# Patient Record
Sex: Female | Born: 1980 | Hispanic: Yes | Marital: Single
Health system: Southern US, Community
[De-identification: ages and names within clinical notes are randomized; demographics above are authoritative.]

## PROBLEM LIST (undated history)

## (undated) DIAGNOSIS — O169 Unspecified maternal hypertension, unspecified trimester: Secondary | ICD-10-CM

## (undated) HISTORY — PX: NEPHRECTOMY: SHX65

---

## 2020-04-25 ENCOUNTER — Emergency Department (HOSPITAL_COMMUNITY): Payer: Self-pay

## 2020-04-25 ENCOUNTER — Other Ambulatory Visit: Payer: Self-pay

## 2020-04-25 ENCOUNTER — Emergency Department (HOSPITAL_COMMUNITY)
Admission: EM | Admit: 2020-04-25 | Discharge: 2020-04-25 | Disposition: A | Discharge status: Home / Self Care | Payer: Self-pay | Attending: Emergency Medicine | Admitting: Emergency Medicine

## 2020-04-25 ENCOUNTER — Encounter (HOSPITAL_COMMUNITY): Payer: Self-pay | Admitting: Emergency Medicine

## 2020-04-25 DIAGNOSIS — R102 Pelvic and perineal pain: Secondary | ICD-10-CM | POA: Insufficient documentation

## 2020-04-25 HISTORY — DX: Unspecified maternal hypertension, unspecified trimester: O16.9

## 2020-04-25 LAB — I-STAT BETA HCG BLOOD, ED (MC, WL, AP ONLY): I-stat hCG, quantitative: <5 m[IU]/mL (ref <5)

## 2020-04-25 MED ORDER — HYDRALAZINE HCL 20 MG/ML IJ SOLN
10.0000 mg | Freq: Once | INTRAMUSCULAR | Status: AC
  Administered 2020-04-25: 10 mg via INTRAVENOUS
  Filled 2020-04-25: qty 1

## 2020-04-25 MED ORDER — HYDRALAZINE HCL 10 MG PO TABS: 10.0000 mg | ORAL_TABLET | Freq: Three times a day (TID) | ORAL | 0 refills | Status: AC

## 2020-04-25 NOTE — Discharge Instructions (Addendum)
Please follow up with a primary care provider for further evaluation if symptoms continue. You are being started on hydralazine and will need to have your blood pressure followed up as an outpatient

## 2020-04-25 NOTE — ED Triage Notes (Signed)
Pt states she is approx 4 months pregnant with no prenatal care.  Denies any symptoms but states she is here for routine prenatal care.  States she is still having menstrual cycles.  Reports G4P3

## 2020-04-25 NOTE — ED Notes (Signed)
Discharge instructions discussed with pt via interpretor. Pt verbalized understanding. Pt stable and ambulatory. No signature pad available 

## 2020-04-25 NOTE — ED Provider Notes (Addendum)
[facility] EMERGENCY DEPARTMENT Provider Note   CSN: [id number] Arrival date & time: 04/25/20  1047     History Chief Complaint  Patient presents with  . pregnancy possibly    Brandy Pugh is a 39 y.o. female.  HPI  Level 5 caveat History obtained through interpreter 39 year old female G4 P3 presents today stating that she thinks she is 4 months pregnant.  She states she has had irregular bleeding over the past 4 months.  She feels that her lower abdomen is swollen and feels that she has movement in her lower abdomen back when she was pregnant.  Additionally she states that she has had high blood pressure only when she is pregnant.  She is hypertensive today.  It is unclear from the history whether or not she has had this for a regular basis.  She states she had 2 home pregnancy test that showed 1 line only, which I interpret is negative.     Past Medical History:  Diagnosis Date  . Hypertension during pregnancy     There are no problems to display for this patient.   Past Surgical History:  Procedure Laterality Date  . NEPHRECTOMY     patient states she had kidney removed for a cyst     OB History    Gravida  4   Para  3   Term      Preterm      AB      Living        SAB      TAB      Ectopic      Multiple      Live Births              No family history on file.  Social History   Tobacco Use  . Smoking status: Never Smoker  . Smokeless tobacco: Never Used  Substance Use Topics  . Alcohol use: Not Currently  . Drug use: Not Currently    Home Medications Prior to Admission medications   Not on File    Allergies    Patient has no allergy information on record.  Review of Systems   Review of Systems  All other systems reviewed and are negative.   Physical Exam Updated Vital Signs BP (!) 159/109   Pulse 85   Temp 98.1 F (36.7 C) (Oral)   Resp 16   SpO2 99%   Physical Exam Vitals and  nursing note reviewed. Exam conducted with a chaperone present.  Constitutional:      General: She is not in acute distress.    Appearance: Normal appearance. She is not ill-appearing.  HENT:     Head: Normocephalic.     Right Ear: External ear normal.     Left Ear: External ear normal.     Nose: Nose normal.     Mouth/Throat:     Mouth: Mucous membranes are moist.  Eyes:     Extraocular Movements: Extraocular movements intact.     Pupils: Pupils are equal, round, and reactive to light.  Cardiovascular:     Rate and Rhythm: Normal rate.     Pulses: Normal pulses.  Pulmonary:     Effort: Pulmonary effort is normal.     Breath sounds: Normal breath sounds.  Abdominal:     General: Abdomen is flat.     Palpations: Abdomen is soft.  Neurological:     Mental Status: She is alert.  ED Results / Procedures / Treatments   Labs (all labs ordered are listed, but only abnormal results are displayed) Labs Reviewed  I-STAT BETA HCG BLOOD, ED ([facility], [facility], [facility] ONLY)    EKG None  Radiology No results found.  Procedures Procedures (including critical care time)  Medications Ordered in ED Medications - No data to display  ED Course  I have reviewed the triage vital signs and the nursing notes.  Pertinent labs & imaging results that were available during my care of the patient were reviewed by me and considered in my medical decision making (see chart for details).    MDM Rules/Calculators/A&P                          [place] of pelvis obtained due to patients complaints of pelvic pressure and fullness. Plan d/c with outpatient f/u Hypertension treated here with hydralazine- bp now down to 152/101- will continue op hydralazine Final Clinical Impression(s) / ED Diagnoses Final diagnoses:  Pelvic pressure in female    Rx / DC Orders ED Discharge Orders    None       Margarita Grizzle, MD 04/25/20 1510    Margarita Grizzle, MD 04/28/20 1324

## 2020-04-25 NOTE — ED Notes (Signed)
Patient transported to Ultrasound 

## 2021-04-25 ENCOUNTER — Encounter (HOSPITAL_COMMUNITY): Payer: Self-pay | Admitting: Neurological Surgery

## 2021-04-25 ENCOUNTER — Inpatient Hospital Stay (HOSPITAL_COMMUNITY)
Admission: EM | Admit: 2021-04-25 | Discharge: 2021-05-27 | DRG: 064 | Disposition: E | Payer: Self-pay | Attending: Internal Medicine | Admitting: Internal Medicine

## 2021-04-25 ENCOUNTER — Emergency Department (HOSPITAL_COMMUNITY): Payer: Self-pay

## 2021-04-25 DIAGNOSIS — T1490XA Injury, unspecified, initial encounter: Principal | ICD-10-CM

## 2021-04-25 DIAGNOSIS — I619 Nontraumatic intracerebral hemorrhage, unspecified: Secondary | ICD-10-CM | POA: Diagnosis present

## 2021-04-25 DIAGNOSIS — Z515 Encounter for palliative care: Secondary | ICD-10-CM

## 2021-04-25 DIAGNOSIS — Z7189 Other specified counseling: Secondary | ICD-10-CM

## 2021-04-25 DIAGNOSIS — Z529 Donor of unspecified organ or tissue: Secondary | ICD-10-CM

## 2021-04-25 DIAGNOSIS — G936 Cerebral edema: Secondary | ICD-10-CM | POA: Diagnosis present

## 2021-04-25 DIAGNOSIS — N179 Acute kidney failure, unspecified: Secondary | ICD-10-CM | POA: Diagnosis not present

## 2021-04-25 DIAGNOSIS — Z9911 Dependence on respirator [ventilator] status: Secondary | ICD-10-CM

## 2021-04-25 DIAGNOSIS — Q600 Renal agenesis, unilateral: Secondary | ICD-10-CM

## 2021-04-25 DIAGNOSIS — Z66 Do not resuscitate: Secondary | ICD-10-CM | POA: Diagnosis present

## 2021-04-25 DIAGNOSIS — R404 Transient alteration of awareness: Secondary | ICD-10-CM | POA: Diagnosis present

## 2021-04-25 DIAGNOSIS — E870 Hyperosmolality and hypernatremia: Secondary | ICD-10-CM | POA: Diagnosis not present

## 2021-04-25 DIAGNOSIS — S0010XA Contusion of unspecified eyelid and periocular area, initial encounter: Secondary | ICD-10-CM | POA: Diagnosis present

## 2021-04-25 DIAGNOSIS — G9349 Other encephalopathy: Secondary | ICD-10-CM | POA: Diagnosis present

## 2021-04-25 DIAGNOSIS — I6011 Nontraumatic subarachnoid hemorrhage from right middle cerebral artery: Principal | ICD-10-CM | POA: Diagnosis present

## 2021-04-25 DIAGNOSIS — G9340 Encephalopathy, unspecified: Secondary | ICD-10-CM | POA: Diagnosis present

## 2021-04-25 DIAGNOSIS — I10 Essential (primary) hypertension: Secondary | ICD-10-CM | POA: Diagnosis present

## 2021-04-25 DIAGNOSIS — X58XXXA Exposure to other specified factors, initial encounter: Secondary | ICD-10-CM | POA: Diagnosis present

## 2021-04-25 DIAGNOSIS — I959 Hypotension, unspecified: Secondary | ICD-10-CM | POA: Diagnosis not present

## 2021-04-25 DIAGNOSIS — Z526 Liver donor: Secondary | ICD-10-CM

## 2021-04-25 DIAGNOSIS — G9382 Brain death: Secondary | ICD-10-CM | POA: Diagnosis not present

## 2021-04-25 DIAGNOSIS — Z20822 Contact with and (suspected) exposure to covid-19: Secondary | ICD-10-CM | POA: Diagnosis present

## 2021-04-25 DIAGNOSIS — J9601 Acute respiratory failure with hypoxia: Secondary | ICD-10-CM | POA: Diagnosis present

## 2021-04-25 DIAGNOSIS — R000 Tachycardia, unspecified: Secondary | ICD-10-CM | POA: Diagnosis present

## 2021-04-25 DIAGNOSIS — I169 Hypertensive crisis, unspecified: Secondary | ICD-10-CM | POA: Diagnosis not present

## 2021-04-25 DIAGNOSIS — I499 Cardiac arrhythmia, unspecified: Secondary | ICD-10-CM | POA: Diagnosis not present

## 2021-04-25 DIAGNOSIS — S0011XA Contusion of right eyelid and periocular area, initial encounter: Secondary | ICD-10-CM | POA: Diagnosis present

## 2021-04-25 LAB — I-STAT ARTERIAL BLOOD GAS, ED
Acid-base deficit: 4 mmol/L — ABNORMAL HIGH (ref 0.0–2.0)
Bicarbonate: 18.8 mmol/L — ABNORMAL LOW (ref 20.0–28.0)
Calcium, Ion: 1.08 mmol/L — ABNORMAL LOW (ref 1.15–1.40)
HCT: 46 % (ref 36.0–46.0)
Hemoglobin: 15.6 g/dL — ABNORMAL HIGH (ref 12.0–15.0)
O2 Saturation: 100 %
Patient temperature: 98.2
Potassium: 4 mmol/L (ref 3.5–5.1)
Sodium: 140 mmol/L (ref 135–145)
TCO2: 20 mmol/L — ABNORMAL LOW (ref 22–32)
pCO2 arterial: 27.7 mmHg — ABNORMAL LOW (ref 32.0–48.0)
pH, Arterial: 7.439 (ref 7.350–7.450)
pO2, Arterial: 261 mmHg — ABNORMAL HIGH (ref 83.0–108.0)

## 2021-04-25 LAB — I-STAT CHEM 8, ED
BUN: 10 mg/dL (ref 6–20)
Calcium, Ion: 0.94 mmol/L — ABNORMAL LOW (ref 1.15–1.40)
Chloride: 107 mmol/L (ref 98–111)
Creatinine, Ser: 1.2 mg/dL — ABNORMAL HIGH (ref 0.44–1.00)
Glucose, Bld: 258 mg/dL — ABNORMAL HIGH (ref 70–99)
HCT: 46 % (ref 36.0–46.0)
Hemoglobin: 15.6 g/dL — ABNORMAL HIGH (ref 12.0–15.0)
Potassium: 3.4 mmol/L — ABNORMAL LOW (ref 3.5–5.1)
Sodium: 138 mmol/L (ref 135–145)
TCO2: 18 mmol/L — ABNORMAL LOW (ref 22–32)

## 2021-04-25 LAB — SODIUM
Sodium: 147 mmol/L — ABNORMAL HIGH (ref 135–145)
Sodium: 147 mmol/L — ABNORMAL HIGH (ref 135–145)

## 2021-04-25 LAB — CBC
HCT: 44.7 % (ref 36.0–46.0)
Hemoglobin: 14.8 g/dL (ref 12.0–15.0)
MCH: 30.5 pg (ref 26.0–34.0)
MCHC: 33.1 g/dL (ref 30.0–36.0)
MCV: 92.2 fL (ref 80.0–100.0)
Platelets: 352 10*3/uL (ref 150–400)
RBC: 4.85 MIL/uL (ref 3.87–5.11)
RDW: 12.1 % (ref 11.5–15.5)
WBC: 25.8 10*3/uL — ABNORMAL HIGH (ref 4.0–10.5)
nRBC: 0 % (ref 0.0–0.2)

## 2021-04-25 LAB — URINALYSIS, ROUTINE W REFLEX MICROSCOPIC
Bacteria, UA: NONE SEEN
Bilirubin Urine: NEGATIVE
Glucose, UA: 150 mg/dL — AB
Ketones, ur: NEGATIVE mg/dL
Leukocytes,Ua: NEGATIVE
Nitrite: NEGATIVE
Protein, ur: 100 mg/dL — AB
Specific Gravity, Urine: 1.017 (ref 1.005–1.030)
pH: 7 (ref 5.0–8.0)

## 2021-04-25 LAB — COMPREHENSIVE METABOLIC PANEL
ALT: 22 U/L (ref 0–44)
AST: 33 U/L (ref 15–41)
Albumin: 4 g/dL (ref 3.5–5.0)
Alkaline Phosphatase: 74 U/L (ref 38–126)
Anion gap: 16 — ABNORMAL HIGH (ref 5–15)
BUN: 8 mg/dL (ref 6–20)
CO2: 15 mmol/L — ABNORMAL LOW (ref 22–32)
Calcium: 8.7 mg/dL — ABNORMAL LOW (ref 8.9–10.3)
Chloride: 105 mmol/L (ref 98–111)
Creatinine, Ser: 1.44 mg/dL — ABNORMAL HIGH (ref 0.44–1.00)
GFR, Estimated: 47 mL/min — ABNORMAL LOW (ref >60)
Glucose, Bld: 255 mg/dL — ABNORMAL HIGH (ref 70–99)
Potassium: 3.3 mmol/L — ABNORMAL LOW (ref 3.5–5.1)
Sodium: 136 mmol/L (ref 135–145)
Total Bilirubin: 0.4 mg/dL (ref 0.3–1.2)
Total Protein: 8.1 g/dL (ref 6.5–8.1)

## 2021-04-25 LAB — RAPID URINE DRUG SCREEN, HOSP PERFORMED
Amphetamines: POSITIVE — AB
Barbiturates: NOT DETECTED
Benzodiazepines: NOT DETECTED
Cocaine: NOT DETECTED
Opiates: NOT DETECTED
Tetrahydrocannabinol: NOT DETECTED

## 2021-04-25 LAB — LACTIC ACID, PLASMA: Lactic Acid, Venous: 6.4 mmol/L (ref 0.5–1.9)

## 2021-04-25 LAB — SAMPLE TO BLOOD BANK

## 2021-04-25 LAB — PROTIME-INR
INR: 1 (ref 0.8–1.2)
Prothrombin Time: 12.7 seconds (ref 11.4–15.2)

## 2021-04-25 LAB — RESP PANEL BY RT-PCR (FLU A&B, COVID) ARPGX2
Influenza A by PCR: NEGATIVE
Influenza B by PCR: NEGATIVE
SARS Coronavirus 2 by RT PCR: NEGATIVE

## 2021-04-25 LAB — MRSA NEXT GEN BY PCR, NASAL: MRSA by PCR Next Gen: NOT DETECTED

## 2021-04-25 LAB — ETHANOL: Alcohol, Ethyl (B): <10 mg/dL (ref <10)

## 2021-04-25 LAB — HIV ANTIBODY (ROUTINE TESTING W REFLEX): HIV Screen 4th Generation wRfx: NONREACTIVE

## 2021-04-25 MED ORDER — IOHEXOL 350 MG/ML SOLN
100.0000 mL | Freq: Once | INTRAVENOUS | Status: AC | PRN
  Administered 2021-04-25: 100 mL via INTRAVENOUS

## 2021-04-25 MED ORDER — POLYETHYLENE GLYCOL 3350 17 G PO PACK: 17.0000 g | PACK | Freq: Every day | ORAL | Status: DC | PRN

## 2021-04-25 MED ORDER — CHLORHEXIDINE GLUCONATE CLOTH 2 % EX PADS
6.0000 | MEDICATED_PAD | Freq: Every day | CUTANEOUS | Status: DC
  Administered 2021-04-25: 6 via TOPICAL

## 2021-04-25 MED ORDER — PROPOFOL 1000 MG/100ML IV EMUL
INTRAVENOUS | Status: AC | PRN
  Administered 2021-04-25: 40 ug/kg/min via INTRAVENOUS

## 2021-04-25 MED ORDER — ROCURONIUM BROMIDE 50 MG/5ML IV SOLN
INTRAVENOUS | Status: AC | PRN
Start: 2021-04-25 — End: 2021-04-25
  Administered 2021-04-25: 60 mg via INTRAVENOUS

## 2021-04-25 MED ORDER — NICARDIPINE HCL IN NACL 20-0.86 MG/200ML-% IV SOLN
INTRAVENOUS | Status: AC
  Administered 2021-04-25: 5 mg/h via INTRAVENOUS
  Filled 2021-04-25: qty 200

## 2021-04-25 MED ORDER — ORAL CARE MOUTH RINSE
15.0000 mL | OROMUCOSAL | Status: DC
  Administered 2021-04-25 – 2021-05-01 (×54): 15 mL via OROMUCOSAL

## 2021-04-25 MED ORDER — SODIUM CHLORIDE 3 % IV SOLN
INTRAVENOUS | Status: DC
  Filled 2021-04-25 (×2): qty 500

## 2021-04-25 MED ORDER — ETOMIDATE 2 MG/ML IV SOLN
INTRAVENOUS | Status: AC | PRN
  Administered 2021-04-25: 20 mg via INTRAVENOUS

## 2021-04-25 MED ORDER — DOCUSATE SODIUM 50 MG/5ML PO LIQD
100.0000 mg | Freq: Two times a day (BID) | ORAL | Status: DC | PRN
Start: 2021-04-25 — End: 2021-04-26

## 2021-04-25 MED ORDER — MIDAZOLAM HCL 2 MG/2ML IJ SOLN: 2.0000 mg | INTRAMUSCULAR | Status: DC | PRN

## 2021-04-25 MED ORDER — FENTANYL CITRATE PF 50 MCG/ML IJ SOSY: 50.0000 ug | PREFILLED_SYRINGE | INTRAMUSCULAR | Status: DC | PRN

## 2021-04-25 MED ORDER — LABETALOL HCL 5 MG/ML IV SOLN
INTRAVENOUS | Status: AC
  Administered 2021-04-25: 10 mg via INTRAVENOUS
  Filled 2021-04-25: qty 4

## 2021-04-25 MED ORDER — ONDANSETRON HCL 4 MG/2ML IJ SOLN: 4.0000 mg | Freq: Four times a day (QID) | INTRAMUSCULAR | Status: DC | PRN

## 2021-04-25 MED ORDER — LABETALOL HCL 5 MG/ML IV SOLN
10.0000 mg | INTRAVENOUS | Status: AC | PRN
  Administered 2021-04-25: 10 mg via INTRAVENOUS
  Filled 2021-04-25: qty 4

## 2021-04-25 MED ORDER — CHLORHEXIDINE GLUCONATE 0.12% ORAL RINSE (MEDLINE KIT)
15.0000 mL | Freq: Two times a day (BID) | OROMUCOSAL | Status: DC
  Administered 2021-04-25 – 2021-05-01 (×13): 15 mL via OROMUCOSAL

## 2021-04-25 MED ORDER — NICARDIPINE HCL IN NACL 20-0.86 MG/200ML-% IV SOLN: 3.0000 mg/h | INTRAVENOUS | Status: DC

## 2021-04-25 MED ORDER — SODIUM CHLORIDE 3 % IV BOLUS
250.0000 mL | Freq: Once | INTRAVENOUS | Status: AC
  Administered 2021-04-25: 250 mL via INTRAVENOUS

## 2021-04-25 MED ORDER — NOREPINEPHRINE 4 MG/250ML-% IV SOLN
0.0000 ug/min | INTRAVENOUS | Status: DC
Start: 2021-04-25 — End: 2021-05-01
  Administered 2021-04-25: 7 ug/min via INTRAVENOUS
  Administered 2021-04-25: 10 ug/min via INTRAVENOUS
  Administered 2021-04-26 – 2021-04-27 (×2): 4 ug/min via INTRAVENOUS
  Administered 2021-04-27: 8 ug/min via INTRAVENOUS
  Administered 2021-04-27: 7 ug/min via INTRAVENOUS
  Administered 2021-04-28: 24 ug/min via INTRAVENOUS
  Administered 2021-04-28: 14 ug/min via INTRAVENOUS
  Administered 2021-04-28: 22 ug/min via INTRAVENOUS
  Administered 2021-04-28: 17 ug/min via INTRAVENOUS
  Administered 2021-04-28: 10 ug/min via INTRAVENOUS
  Administered 2021-04-29: 24 ug/min via INTRAVENOUS
  Administered 2021-04-29 (×2): 21 ug/min via INTRAVENOUS
  Administered 2021-04-29: 26 ug/min via INTRAVENOUS
  Administered 2021-04-29: 22 ug/min via INTRAVENOUS
  Administered 2021-04-29: 24 ug/min via INTRAVENOUS
  Administered 2021-04-29: 23 ug/min via INTRAVENOUS
  Administered 2021-04-30: 5 ug/min via INTRAVENOUS
  Administered 2021-04-30: 13 ug/min via INTRAVENOUS
  Administered 2021-04-30: 12 ug/min via INTRAVENOUS
  Administered 2021-04-30: 16 ug/min via INTRAVENOUS
  Filled 2021-04-25 (×13): qty 250
  Filled 2021-04-25: qty 500
  Filled 2021-04-25 (×6): qty 250
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
## 2021-04-25 NOTE — ED Notes (Signed)
Medical examiner transferred to Dr. Rhunette Croft per his request
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
## 2021-04-25 NOTE — Progress Notes (Signed)
Patient transported to CT and back to TRA- B on ventilator with no complications.

## 2021-04-25 NOTE — Progress Notes (Signed)
RT assisted with transportation of this pt from ED to 4N22 while on full ventilatory support with no complications. RT gave report to 4N RT. RN currently at bedside.
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
## 2021-04-25 NOTE — ED Notes (Signed)
Chaplain paged for family. 
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
## 2021-04-25 NOTE — ED Notes (Signed)
RN spoke with Molly Maduro and Higden from [place], [phone]

## 2021-04-25 NOTE — H&P (Signed)
NAME:  Brandy Pugh, MRN:  [id number], DOB:  [birth year], LOS: 0 ADMISSION DATE:  03/30/2021, CONSULTATION DATE:  04/09/2021 REFERRING MD:  ed provider, CHIEF COMPLAINT: Unresponsive  History of Present Illness:  40 year old status found unresponsive.  Brought to the ED.  Intubated.  Imaging with devastating subarachnoid hemorrhage and cerebral hemorrhage.  Unsurvivable.  Neurosurgery and surgery is waiting.  DNR.  Awaiting on a bridge and organ donation evaluation.  PCCM to admit  Pertinent  Medical History  Unobtainable due to patient factors  Significant Hospital Events: Including procedures, antibiotic start and stop dates in addition to other pertinent events   10/30 admitted with encephalopathy, intubated, devastating brain bleed, subarachnoid hemorrhage on imaging  Interim History / Subjective:  As above  Objective   Blood pressure (!) 115/92, pulse 93, temperature 99.1 F (37.3 C), resp. rate 16, height 4\' 11"  (1.499 m), weight 60 kg, SpO2 100 %.    Vent Mode: PRVC FiO2 (%):  [40 %-100 %] 40 % Set Rate:  [16 bmp] 16 bmp Vt Set:  [350 mL] 350 mL PEEP:  [5 cmH20] 5 cmH20 Plateau Pressure:  [17 cmH20-18 cmH20] 17 cmH20   Intake/Output Summary (Last 24 hours) at 04/26/2021 1326 Last data filed at 03/31/2021 1325 Gross per 24 hour  Intake 1317.38 ml  Output 2350 ml  Net -1032.62 ml   Filed Weights   04/15/2021 0502  Weight: 60 kg    Examination: General: Intubated, sedated Neuro: Pupils fixed, no cough, gag present, will breathe over the vent  Resolved Hospital Problem list     Assessment & Plan:  Devastating intracerebral hemorrhage and subarachnoid hemorrhage: ICH score 5, not survivable.  Neurosurgery eval without intervention available.  Recommend comfort care.  I agree. --Peripheral norepinephrine, do not escalate past 10 mics per minute, no additional pressors --Comfort measures --Awaiting information on organ donation  Acute hypoxemic  respiratory failure: In the setting of encephalopathy from devastating brain bleed. --PRVC --Withdrawal once organ donation is decided, if develops brain death that we will need to be evaluated for   Best Practice (right click and "Reselect all SmartList Selections" daily)   DNR, comfort measures, n.p.o., no lines, Foley for comfort  Labs   CBC: Recent Labs  Lab 04/04/2021 0419 04/12/2021 0431 04/21/2021 0610  WBC 25.8*  --   --   HGB 14.8 15.6* 15.6*  HCT 44.7 46.0 46.0  MCV 92.2  --   --   PLT 352  --   --     Basic Metabolic Panel: Recent Labs  Lab 04/07/2021 0419 03/27/2021 0431 03/31/2021 0610  NA 136 138 140  K 3.3* 3.4* 4.0  CL 105 107  --   CO2 15*  --   --   GLUCOSE 255* 258*  --   BUN 8 10  --   CREATININE 1.44* 1.20*  --   CALCIUM 8.7*  --   --    GFR: Estimated Creatinine Clearance: 48.1 mL/min (A) (by C-G formula based on SCr of 1.2 mg/dL (H)). Recent Labs  Lab 04/12/2021 0419  WBC 25.8*  LATICACIDVEN 6.4*    Liver Function Tests: Recent Labs  Lab 03/29/2021 0419  AST 33  ALT 22  ALKPHOS 74  BILITOT 0.4  PROT 8.1  ALBUMIN 4.0   No results for input(s): LIPASE, AMYLASE in the last 168 hours. No results for input(s): AMMONIA in the last 168 hours.  ABG    Component Value Date/Time   PHART 7.439 04/24/2021 0610  PCO2ART 27.7 (L) May 05, 2021 0610   PO2ART 261 (H) 2021/05/05 0610   HCO3 18.8 (L) 05-05-2021 0610   TCO2 20 (L) 05/05/21 0610   ACIDBASEDEF 4.0 (H) 2021/05/05 0610   O2SAT 100.0 05-05-2021 0610     Coagulation Profile: Recent Labs  Lab 05-May-2021 0419  INR 1.0    Cardiac Enzymes: No results for input(s): CKTOTAL, CKMB, CKMBINDEX, TROPONINI in the last 168 hours.  HbA1C: No results found for: HGBA1C  CBG: No results for input(s): GLUCAP in the last 168 hours.  Review of Systems:   Unable to obtain due to patient factors DNR, comfort measures, Foley for comfort, no central line, A-line, no nutrition at this time  Past  Medical History:  She,  has no past medical history on file.   Surgical History:  History reviewed. No pertinent surgical history.   Social History:      Family History:  Her family history is not on file.   Allergies Not on File   Home Medications  Prior to Admission medications   Not on File     Critical care time: n/a

## 2021-04-25 NOTE — ED Notes (Signed)
[place], [employer], CSI, and Detectives are at bedside.

## 2021-04-25 NOTE — ED Notes (Signed)
7262 Neurosurgery at bedside, noted flexion when painful stimuli performed while propofol turned off

## 2021-04-25 NOTE — ED Notes (Signed)
Lactic acid 6.4 verbalized to Dr. Clayborne Dana
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
## 2021-04-26 DIAGNOSIS — I619 Nontraumatic intracerebral hemorrhage, unspecified: Secondary | ICD-10-CM

## 2021-04-26 LAB — GLUCOSE, CAPILLARY
Glucose-Capillary: 136 mg/dL — ABNORMAL HIGH (ref 70–99)
Glucose-Capillary: 132 mg/dL — ABNORMAL HIGH (ref 70–99)
Glucose-Capillary: 144 mg/dL — ABNORMAL HIGH (ref 70–99)

## 2021-04-26 LAB — SODIUM
Sodium: 148 mmol/L — ABNORMAL HIGH (ref 135–145)
Sodium: 149 mmol/L — ABNORMAL HIGH (ref 135–145)

## 2021-04-26 MED ORDER — CHLORHEXIDINE GLUCONATE CLOTH 2 % EX PADS
6.0000 | MEDICATED_PAD | Freq: Every day | CUTANEOUS | Status: DC
  Administered 2021-04-27 – 2021-05-01 (×5): 6 via TOPICAL
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
## 2021-04-26 NOTE — Progress Notes (Signed)
**Note Brandy-Identified via Obfuscation** Family conference call today with son, Brandy Pugh, who lives in [place]. Meeting facilitated by myself, Amil Amen from Palliative, and Graciella from interpretive services. Brandy Pugh is the closest next-of-kin that could make decisions for his mother and he is 40years old. There are 2 other children, 2 daughters under the age of 71. The daughters live with the father, Brandy Pugh, in [place], [place]. Brandy Pugh is requesting his father's presence at [facility] beside to aid in the decision making process in his absence.  We made attempts to contact [place] directly and were unsuccessful. We are under the assumption that Brandy Pugh is en-route to [facility] and will hopefully be present tomorrow.   The grave prognosis was explained in depth with Brandy Pugh and all questions were answered. Will continue to stand-by for further input from family. Until then we are keeping the current plan-pt is DNR and requiring supportive care from the ventilator and a vasopressor.
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
## 2021-04-26 NOTE — TOC CAGE-AID Note (Signed)
Transition of Care [facility]) - CAGE-AID Screening   Patient Details  Name: Brandy Pugh MRN: [id number] Date of Birth: 1981-04-15  Transition of Care [facility]) CM/SW Contact:    Ladan Vanderzanden C Pugh, LCSWA Phone Number: 04/26/2021, 12:12 PM   Clinical Narrative: Pt is unable to participate in Cage Aid.  Pt is intubated.  Brandy Pugh, MSW, LCSW-A Pronouns:  She/Her/Hers [facility] HealthTransitions of Care Clinical Social Worker Direct Number:  [phone] [email or web address]  CAGE-AID Screening: Substance Abuse Screening unable to be completed due to: : Patient unable to participate (Pt is intubated.)             Substance Abuse Education Offered: No

## 2021-04-27 DIAGNOSIS — Z66 Do not resuscitate: Secondary | ICD-10-CM

## 2021-04-27 LAB — BASIC METABOLIC PANEL
Anion gap: 7 (ref 5–15)
BUN: 25 mg/dL — ABNORMAL HIGH (ref 6–20)
CO2: 21 mmol/L — ABNORMAL LOW (ref 22–32)
Calcium: 7.8 mg/dL — ABNORMAL LOW (ref 8.9–10.3)
Chloride: 125 mmol/L — ABNORMAL HIGH (ref 98–111)
Creatinine, Ser: 3.01 mg/dL — ABNORMAL HIGH (ref 0.44–1.00)
GFR, Estimated: 19 mL/min — ABNORMAL LOW (ref >60)
Glucose, Bld: 136 mg/dL — ABNORMAL HIGH (ref 70–99)
Potassium: 4.2 mmol/L (ref 3.5–5.1)
Sodium: 153 mmol/L — ABNORMAL HIGH (ref 135–145)

## 2021-04-27 MED ORDER — LACTATED RINGERS IV SOLN: INTRAVENOUS | Status: DC

## 2021-04-27 NOTE — Progress Notes (Signed)
Spoke with pt's cousin, Morton Stall, on the phone.  She stated that De Nurse is on the way to [facility].  His ETA is tomorrow morning at 0600.  Will continue to monitor the situation.

## 2021-04-27 NOTE — Progress Notes (Signed)
Daily Progress Note   Patient Name: Brandy Pugh       Date: 04/27/2021 DOB: May 12, 1981  Age: 40 y.o. MRN#: [id number] Attending Physician: Lorin Glass, MD Primary Care Physician: Default, Provider, MD Admit Date: 2021-05-24  Reason for Consultation/Follow-up: Establishing goals of care  Subjective: Update received from bedside RN. Levophed is at . Neuro exam remains unchanged (no gag or corneal reflex).    With Graciella as interpreter, I spoke with Elson Clan (patient's former partner and father of her 3 children) by phone. I reviewed patient's current medical condition with him in detail. I provided education on the diagnosis of ruptured aneurysm and discussed the natural trajectory of a sudden and severe neurological injury. Emphasized that the medical team has concurred this is a devastating brain bleed with no chance of neurologic recovery. Emphasized that she is unresponsive and on life support. Marco verbalizes understanding. He states he is in the process of fixing his vehicle and is hopeful to leave [place] today. Discussed that Jordan Likes (patient's oldest son) has deferred decision making to Reola Mosher states he is accepting of this responsibility, but is not willing to make any decisions until he can he can see patient in-person.    Length of Stay: 2    Palliative Assessment/Data: PPS 10%      Palliative Care Assessment & Plan   HPI/Patient Profile: 40 y.o. female  with unknown past medical history who was brought to the emergency department on 05-24-21 after being found unresponsive at home. 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 Glasgow of 3 and fixed pupils. Initially very  hypertensive, but then became hypotensive. Per neurosurgery, "very grim prognosis for functional recovery" and no recommendation for surgery, ventriculostomy, or interventional radiology.  Admitted to PCCM.    Assessment: Very unfortunate patient with a non-survivable brain bleed secondary to ruptured aneurysm. Complicated family and social situation and challenges with locating and establishing a decision maker.  Recommendations/Plan: Continue current care until family arrives (hopefully tomorrow) Patient's oldest son Jordan Likes has deferred decision making to his father De Nurse (patient's former partner and father of her 3 children)  If in-person interpretation needed for Wednesday 11/2, will need to call [phone] PMT will continue to follow and support  Code Status: DNR  Prognosis:  Very poor  Discharge Planning: Anticipated Hospital Death   Thank you for allowing the Palliative Medicine Team to assist in the care of this patient.   Total Time 35 minutes Prolonged Time Billed  no       Greater than 50%  of this time was spent counseling and coordinating care related to the above assessment and plan.  Merry Proud, NP  Please contact Palliative Medicine Team phone at [phone] for questions and concerns.
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
## 2021-04-27 NOTE — Progress Notes (Deleted)
Family at bedside Informed them of patient's passing Patient is not a registered organ donor Extubation order placed  Myrla Halsted MD PCCM

## 2021-04-27 DEATH — deceased

## 2021-04-28 ENCOUNTER — Inpatient Hospital Stay (HOSPITAL_COMMUNITY): Payer: Self-pay

## 2021-04-28 LAB — POCT I-STAT 7, (LYTES, BLD GAS, ICA,H+H)
Acid-base deficit: 4 mmol/L — ABNORMAL HIGH (ref 0.0–2.0)
Acid-base deficit: 5 mmol/L — ABNORMAL HIGH (ref 0.0–2.0)
Bicarbonate: 24.6 mmol/L (ref 20.0–28.0)
Bicarbonate: 20.9 mmol/L (ref 20.0–28.0)
Calcium, Ion: 1.28 mmol/L (ref 1.15–1.40)
Calcium, Ion: 1.23 mmol/L (ref 1.15–1.40)
HCT: 35 % — ABNORMAL LOW (ref 36.0–46.0)
HCT: 54 % — ABNORMAL HIGH (ref 36.0–46.0)
Hemoglobin: 11.9 g/dL — ABNORMAL LOW (ref 12.0–15.0)
Hemoglobin: 18.4 g/dL — ABNORMAL HIGH (ref 12.0–15.0)
O2 Saturation: 91 %
O2 Saturation: 92 %
Patient temperature: 100
Potassium: 4.7 mmol/L (ref 3.5–5.1)
Potassium: 4.6 mmol/L (ref 3.5–5.1)
Sodium: 157 mmol/L — ABNORMAL HIGH (ref 135–145)
Sodium: 158 mmol/L — ABNORMAL HIGH (ref 135–145)
TCO2: 26 mmol/L (ref 22–32)
TCO2: 22 mmol/L (ref 22–32)
pCO2 arterial: 61.1 mmHg — ABNORMAL HIGH (ref 32.0–48.0)
pCO2 arterial: 41.4 mmHg (ref 32.0–48.0)
pH, Arterial: 7.213 — ABNORMAL LOW (ref 7.350–7.450)
pH, Arterial: 7.315 — ABNORMAL LOW (ref 7.350–7.450)
pO2, Arterial: 76 mmHg — ABNORMAL LOW (ref 83.0–108.0)
pO2, Arterial: 73 mmHg — ABNORMAL LOW (ref 83.0–108.0)

## 2021-04-28 LAB — BASIC METABOLIC PANEL
Anion gap: 8 (ref 5–15)
BUN: 30 mg/dL — ABNORMAL HIGH (ref 6–20)
CO2: 21 mmol/L — ABNORMAL LOW (ref 22–32)
Calcium: 8.6 mg/dL — ABNORMAL LOW (ref 8.9–10.3)
Chloride: 125 mmol/L — ABNORMAL HIGH (ref 98–111)
Creatinine, Ser: 3.48 mg/dL — ABNORMAL HIGH (ref 0.44–1.00)
GFR, Estimated: 16 mL/min — ABNORMAL LOW (ref >60)
Glucose, Bld: 147 mg/dL — ABNORMAL HIGH (ref 70–99)
Potassium: 4.6 mmol/L (ref 3.5–5.1)
Sodium: 154 mmol/L — ABNORMAL HIGH (ref 135–145)

## 2021-04-28 MED ORDER — TECHNETIUM TC 99M EXAMETAZIME IV KIT
20.9000 | PACK | Freq: Once | INTRAVENOUS | Status: AC | PRN
  Administered 2021-04-28: 20.9 via INTRAVENOUS

## 2021-04-28 MED ORDER — FREE WATER
400.0000 mL | Freq: Four times a day (QID) | Status: DC
  Administered 2021-04-28 – 2021-04-29 (×5): 400 mL

## 2021-04-28 MED ORDER — LACTATED RINGERS IV BOLUS
1000.0000 mL | Freq: Once | INTRAVENOUS | Status: AC
  Administered 2021-04-28: 1000 mL via INTRAVENOUS

## 2021-04-28 NOTE — Progress Notes (Signed)
PCCM Progress Note  Name: Brandy Pugh MRN: [id number] DOB: [birth year] Date of admission: 2021/04/27 CC: unresponsive  Summary: 40 y/o female found unresponsive and brought to ER.  Intubated for airway protection.  Neuro imaging showed Grade 5 SAH and ICH 6 x 4 cm.  Seen by neurosurgery and was deemed not a candidate for intervention with poor prognosis.  Family opted for DNR status.  Subjective: On vent Boyfriend at bedside  Vitals: BP (!) 97/53 (BP Location: Right Arm) Comment: NP aware not at goal, levo maxed, bolus running  Pulse (!) 120   Temp 100 F (37.8 C) (Axillary)   Resp 16   Ht 4\' 11"  (1.499 m)   Wt 61.6 kg   SpO2 90%   BMI 27.43 kg/m   Intake/outpt: I/O last 3 completed shifts: In: 2295.6 [I.V.:2295.6] Out: 1675 [Urine:1675]  Physical exam: GCS3 No cranial nerves Has stopped triggering vent Scattered rhonci R suborbital ecchymoses  Assessment: Grade 5 SAH from rupturred Rt MCA aneurysm measuring 3 mm with 6.6 x 4.7 cm hematoma with 60 cc volume centered in lower Rt sylvian fissure with 9 mm midline shift Acute hypoxemic respiratory failure due to inability to protect airway Worsening renal dysfunction  Plan: DNR  I think she has progressed to brain death, will do formal testing May be an ME case, RN to reach out Need to touch base with whoever NOK is, will also ask RN to help determine this Continue supportive care including vent, starting free water in interim   33 minutes critical care time 1676 MD PCCM

## 2021-04-28 NOTE — Procedures (Signed)
Adult Brain Death Determination  Time of Examination: 2021-05-22 3:01 PM  No Evidence of /Cause of Reversible CNS Depression  Core temperature must be greater >36 degrees. Last temp: Temp: 98.9 F (37.2 C) (Note: If unable to achieve normothermia after 12 hours of temperature management, may consider proceeding with Brain Death Evaluation.):    yes  Evidence of severe metabolic perturbations that could potentate CNS depression. Consider glucose, Na, creatinine, PaCO2, SaO2.:    Absent  Evidence of drugs, by history or measurement, that could potentiate central nervous system depression: narcotics, ethanol, benzodiazepines, barbiturates, neuromuscular blockade.:     Absent  Absence of Cortical Function  GCS = 3:    yes  Absence of Brain Stem Reflexes and Responses  Pupils light-fixed    yes  Corneal reflexes:    Absent  Response to upper and lower airway stimulation, such as pharyngeal and endotracheal suctioning.:    Absent  Ocular response to head turning (eye movement).    Absent  Absence of Spontaneous Respirations  (Apnea test performed per Brain Death Policy. If not met due to hemodynamic/ventilatory instability, then perform EEG, TCD, or cerebral blow flow studies.)  1.   Spontaneous Respirations   Absent  Too unstable due to high oxygen and ventilation needs to perform apnea test safely so cerebral flow study performed.  Document Confirmatory Test Utilized: (Optional) Nuclear cerebral flow, cerebral angiography (CT/MR angio), transcranial Doppler ultrasound, EEG, SSEP (record results).  Test results (if available):  May 22, 2021 Nuclear brain flow study: no flow  Patient pronounced dead by neurological criteria at 15:00 on 2021/05/22.  Brandy Furbish, MD 05/22/21 3:01 PM

## 2021-04-28 NOTE — Progress Notes (Signed)
Met with family this AM and discussed brain death and nuclear scan to confirm.  She is DNR, they will have until AM to say their goodbyes and we will remove from ventilator.  Erskine Emery MD PCCM
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
## 2021-04-28 NOTE — Progress Notes (Signed)
Pt transported on vent  from 4N22 to nuclear med and back without any complications. Vitals stable, RN at bedside, RT will continue to monitor.

## 2021-04-28 NOTE — Progress Notes (Addendum)
Regarding the question of trauma induced, here are the medical findings: - Has a clear ruptured R MCA aneurysm by CTA - No evidence of trauma on pan-CT - She has a small R periorbital hematoma on exam but not on CT - Amphetamines found in urine  Myrla Halsted MD PCCM

## 2021-04-28 NOTE — Progress Notes (Signed)
Lung mechanics are worsening, unable to get adequate ventilation and oxygenation for apnea testing.  Will proceed with nuclear flow scan.  Still awaiting family.  Myrla Halsted MD PCCM

## 2021-04-29 ENCOUNTER — Other Ambulatory Visit (HOSPITAL_COMMUNITY): Payer: Self-pay

## 2021-04-29 DIAGNOSIS — Z529 Donor of unspecified organ or tissue: Secondary | ICD-10-CM

## 2021-04-29 LAB — POCT I-STAT 7, (LYTES, BLD GAS, ICA,H+H)
Acid-base deficit: 9 mmol/L — ABNORMAL HIGH (ref 0.0–2.0)
Bicarbonate: 15.9 mmol/L — ABNORMAL LOW (ref 20.0–28.0)
Calcium, Ion: 1.18 mmol/L (ref 1.15–1.40)
HCT: 34 % — ABNORMAL LOW (ref 36.0–46.0)
Hemoglobin: 11.6 g/dL — ABNORMAL LOW (ref 12.0–15.0)
O2 Saturation: 99 %
Patient temperature: 98.3
Potassium: 4.1 mmol/L (ref 3.5–5.1)
Sodium: 151 mmol/L — ABNORMAL HIGH (ref 135–145)
TCO2: 17 mmol/L — ABNORMAL LOW (ref 22–32)
pCO2 arterial: 28.9 mmHg — ABNORMAL LOW (ref 32.0–48.0)
pH, Arterial: 7.349 — ABNORMAL LOW (ref 7.350–7.450)
pO2, Arterial: 143 mmHg — ABNORMAL HIGH (ref 83.0–108.0)

## 2021-04-29 LAB — RESP PANEL BY RT-PCR (FLU A&B, COVID) ARPGX2
Influenza A by PCR: NEGATIVE
Influenza B by PCR: NEGATIVE
SARS Coronavirus 2 by RT PCR: NEGATIVE

## 2021-04-29 LAB — MAGNESIUM: Magnesium: 1.7 mg/dL (ref 1.7–2.4)

## 2021-04-29 LAB — COMPREHENSIVE METABOLIC PANEL
ALT: 16 U/L (ref 0–44)
AST: 36 U/L (ref 15–41)
Albumin: 1.6 g/dL — ABNORMAL LOW (ref 3.5–5.0)
Alkaline Phosphatase: 93 U/L (ref 38–126)
Anion gap: 13 (ref 5–15)
BUN: 37 mg/dL — ABNORMAL HIGH (ref 6–20)
CO2: 17 mmol/L — ABNORMAL LOW (ref 22–32)
Calcium: 8.2 mg/dL — ABNORMAL LOW (ref 8.9–10.3)
Chloride: 119 mmol/L — ABNORMAL HIGH (ref 98–111)
Creatinine, Ser: 4.3 mg/dL — ABNORMAL HIGH (ref 0.44–1.00)
GFR, Estimated: 13 mL/min — ABNORMAL LOW (ref >60)
Glucose, Bld: 135 mg/dL — ABNORMAL HIGH (ref 70–99)
Potassium: 4.1 mmol/L (ref 3.5–5.1)
Sodium: 149 mmol/L — ABNORMAL HIGH (ref 135–145)
Total Bilirubin: 0.8 mg/dL (ref 0.3–1.2)
Total Protein: 5.2 g/dL — ABNORMAL LOW (ref 6.5–8.1)

## 2021-04-29 LAB — TYPE AND SCREEN
ABO/RH(D): O POS
Antibody Screen: NEGATIVE

## 2021-04-29 LAB — EXPECTORATED SPUTUM ASSESSMENT W GRAM STAIN, RFLX TO RESP C

## 2021-04-29 LAB — AMYLASE: Amylase: 19 U/L — ABNORMAL LOW (ref 28–100)

## 2021-04-29 LAB — BASIC METABOLIC PANEL
Anion gap: 14 (ref 5–15)
BUN: 34 mg/dL — ABNORMAL HIGH (ref 6–20)
CO2: 17 mmol/L — ABNORMAL LOW (ref 22–32)
Calcium: 8.5 mg/dL — ABNORMAL LOW (ref 8.9–10.3)
Chloride: 117 mmol/L — ABNORMAL HIGH (ref 98–111)
Creatinine, Ser: 4.22 mg/dL — ABNORMAL HIGH (ref 0.44–1.00)
GFR, Estimated: 13 mL/min — ABNORMAL LOW (ref >60)
Glucose, Bld: 152 mg/dL — ABNORMAL HIGH (ref 70–99)
Potassium: 3.5 mmol/L (ref 3.5–5.1)
Sodium: 148 mmol/L — ABNORMAL HIGH (ref 135–145)

## 2021-04-29 LAB — PROTIME-INR
INR: 1.6 — ABNORMAL HIGH (ref 0.8–1.2)
Prothrombin Time: 19 seconds — ABNORMAL HIGH (ref 11.4–15.2)

## 2021-04-29 LAB — LIPASE, BLOOD: Lipase: 20 U/L (ref 11–51)

## 2021-04-29 LAB — CBC
HCT: 36 % (ref 36.0–46.0)
Hemoglobin: 11.2 g/dL — ABNORMAL LOW (ref 12.0–15.0)
MCH: 30.8 pg (ref 26.0–34.0)
MCHC: 31.1 g/dL (ref 30.0–36.0)
MCV: 98.9 fL (ref 80.0–100.0)
Platelets: 130 10*3/uL — ABNORMAL LOW (ref 150–400)
RBC: 3.64 MIL/uL — ABNORMAL LOW (ref 3.87–5.11)
RDW: 13.4 % (ref 11.5–15.5)
WBC: 15.4 10*3/uL — ABNORMAL HIGH (ref 4.0–10.5)
nRBC: 0.1 % (ref 0.0–0.2)

## 2021-04-29 LAB — HEMOGLOBIN A1C
Hgb A1c MFr Bld: 5.6 % (ref 4.8–5.6)
Mean Plasma Glucose: 114.02 mg/dL

## 2021-04-29 LAB — APTT: aPTT: 42 seconds — ABNORMAL HIGH (ref 24–36)

## 2021-04-29 LAB — ABO/RH: ABO/RH(D): O POS

## 2021-04-29 LAB — BILIRUBIN, DIRECT: Bilirubin, Direct: 0.4 mg/dL — ABNORMAL HIGH (ref 0.0–0.2)

## 2021-04-29 LAB — LACTIC ACID, PLASMA: Lactic Acid, Venous: 6.4 mmol/L (ref 0.5–1.9)

## 2021-04-29 LAB — GLUCOSE, CAPILLARY: Glucose-Capillary: 128 mg/dL — ABNORMAL HIGH (ref 70–99)

## 2021-04-29 LAB — PHOSPHORUS: Phosphorus: 7 mg/dL — ABNORMAL HIGH (ref 2.5–4.6)

## 2021-04-29 LAB — HCG, QUANTITATIVE, PREGNANCY: hCG, Beta Chain, Quant, S: 1 m[IU]/mL (ref <5)

## 2021-04-29 MED ORDER — SODIUM CHLORIDE 0.9 % IV SOLN
2000.0000 mg | Freq: Once | INTRAVENOUS | Status: AC
  Administered 2021-04-29: 2000 mg via INTRAVENOUS
  Filled 2021-04-29: qty 32

## 2021-04-29 MED ORDER — LIDOCAINE HCL (PF) 1 % IJ SOLN
INTRAMUSCULAR | Status: AC
  Filled 2021-04-29: qty 30

## 2021-04-29 MED ORDER — INSULIN ASPART 100 UNIT/ML IJ SOLN
20.0000 [IU] | Freq: Once | INTRAMUSCULAR | Status: AC
  Administered 2021-04-29: 20 [IU] via SUBCUTANEOUS

## 2021-04-29 MED ORDER — SODIUM CHLORIDE 0.9 % IV SOLN: INTRAVENOUS | Status: DC | PRN

## 2021-04-29 MED ORDER — PIPERACILLIN-TAZOBACTAM IN DEX 2-0.25 GM/50ML IV SOLN
2.2500 g | Freq: Three times a day (TID) | INTRAVENOUS | Status: DC
  Administered 2021-04-29 – 2021-05-01 (×7): 2.25 g via INTRAVENOUS
  Filled 2021-04-29 (×10): qty 50

## 2021-04-29 MED ORDER — DEXTROSE 50 % IV SOLN
50.0000 mL | Freq: Once | INTRAVENOUS | Status: AC
  Administered 2021-04-29: 50 mL via INTRAVENOUS
  Filled 2021-04-29: qty 50

## 2021-04-29 MED ORDER — LEVOTHYROXINE SODIUM 100 MCG/5ML IV SOLN
10.0000 ug/h | INTRAVENOUS | Status: DC
  Administered 2021-04-29: 10 ug/h via INTRAVENOUS
  Administered 2021-04-30 – 2021-05-01 (×3): 20 ug/h via INTRAVENOUS
  Filled 2021-04-29 (×5): qty 10

## 2021-04-29 MED ORDER — LEVOTHYROXINE SODIUM 100 MCG/5ML IV SOLN
20.0000 ug | Freq: Once | INTRAVENOUS | Status: AC
  Administered 2021-04-29: 20 ug via INTRAVENOUS
  Filled 2021-04-29: qty 5

## 2021-04-29 NOTE — TOC CM/SW Note (Signed)
Patient's friend and children given meal vouchers, per request.  Explained use of meal vouchers, with use of [employer] [phone], Donia Ast, RN, BSN  Trauma/Neuro ICU Case Manager [phone]

## 2021-04-29 NOTE — Procedures (Signed)
Arterial Catheter Insertion Procedure Note  Brandy Pugh  [id number]  [birth year]  Date:04/29/21  Time:6:05 PM    Provider Performing: Eliezer Champagne    Procedure: Insertion of Arterial Line (13244) with US guidance (01027)   Indication(s) Blood pressure monitoring and/or need for frequent ABGs  Consent Risks of the procedure as well as the alternatives and risks of each were explained to the patient and/or caregiver.  Consent for the procedure was obtained and is signed in the bedside chart  Anesthesia None   Time Out Verified patient identification, verified procedure, site/side was marked, verified correct patient position, special equipment/implants available, medications/allergies/relevant history reviewed, required imaging and test results available.   Sterile Technique Maximal sterile technique including full sterile barrier drape, hand hygiene, sterile gown, sterile gloves, mask, hair covering, sterile ultrasound probe cover (if used).   Procedure Description Area of catheter insertion was cleaned with chlorhexidine and draped in sterile fashion. With real-time ultrasound guidance an arterial catheter was placed into the right femoral artery.  Appropriate arterial tracings confirmed on monitor.     Complications/Tolerance None; patient tolerated the procedure well.   EBL Minimal   Specimen(s) None  [name], MSN, APRN, AGACNP-BC [facility]  04/29/2021 , 6:06 PM  Please see [email or web address] for pager details  If no response, please call [phone] After hours, please call Elink at [phone]

## 2021-04-29 NOTE — Procedures (Signed)
Central Venous Catheter Insertion Procedure Note  Brandy Pugh  [id number]  10-Apr-1981  Date:04/29/21  Time:6:04 PM   Provider Performing:Silver Achey Hezzie Bump   Procedure: Insertion of Non-tunneled Central Venous [phone]) with US guidance ([id number])   Indication(s) Medication administration  Consent Risks of the procedure as well as the alternatives and risks of each were explained to the patient and/or caregiver.  Consent for the procedure was obtained and is signed in the bedside chart  Anesthesia Topical only with 1% lidocaine   Timeout Verified patient identification, verified procedure, site/side was marked, verified correct patient position, special equipment/implants available, medications/allergies/relevant history reviewed, required imaging and test results available.  Sterile Technique Maximal sterile technique including full sterile barrier drape, hand hygiene, sterile gown, sterile gloves, mask, hair covering, sterile ultrasound probe cover (if used).  Procedure Description Area of catheter insertion was cleaned with chlorhexidine and draped in sterile fashion.  With real-time ultrasound guidance a central venous catheter was placed into the right femoral vein. Nonpulsatile blood flow and easy flushing noted in all ports.  The catheter was sutured in place and sterile dressing applied.     Complications/Tolerance  Chest x-ray is not ordered for femoral cannulation.  EBL Minimal  Specimen(s) None  [name], MSN, APRN, AGACNP-BC [facility]  04/29/2021 , 6:05 PM  Please see [email or web address] for pager details  If no response, please call [phone] After hours, please call Elink at [phone]

## 2021-04-29 NOTE — Progress Notes (Signed)
PT ABG as follows:  PH 7.34 PCO2 29.1  PO2 144   HCO3 15.9   BE -9 TCO2 17
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
## 2021-04-29 NOTE — Progress Notes (Signed)
eLink Physician-Brief Progress Note Patient Name: Brandy Pugh DOB: April 13, 1981 MRN: [id number]   Date of Service  04/29/2021  HPI/Events of Note  Notified of K 3.5, crea 4.22, GFR 13.   eICU Interventions  Continue to monitor.  Hold off on K repletion.      Intervention Category Minor Interventions: Electrolytes abnormality - evaluation and management  Larinda Buttery 04/29/2021, 5:38 AM

## 2021-04-29 NOTE — Progress Notes (Signed)
With aide of medical interpreter, provided emotional support to family at bedside throughout day as well as answered any questions.

## 2021-04-30 LAB — URINALYSIS, ROUTINE W REFLEX MICROSCOPIC
Bilirubin Urine: NEGATIVE
Glucose, UA: NEGATIVE mg/dL
Ketones, ur: NEGATIVE mg/dL
Nitrite: NEGATIVE
Protein, ur: NEGATIVE mg/dL
Specific Gravity, Urine: 1.004 — ABNORMAL LOW (ref 1.005–1.030)
pH: 5 (ref 5.0–8.0)

## 2021-04-30 LAB — CBC WITH DIFFERENTIAL/PLATELET
Abs Immature Granulocytes: 0.14 10*3/uL — ABNORMAL HIGH (ref 0.00–0.07)
Basophils Absolute: 0 10*3/uL (ref 0.0–0.1)
Basophils Relative: 0 %
Eosinophils Absolute: 0 10*3/uL (ref 0.0–0.5)
Eosinophils Relative: 0 %
HCT: 35.2 % — ABNORMAL LOW (ref 36.0–46.0)
Hemoglobin: 10.8 g/dL — ABNORMAL LOW (ref 12.0–15.0)
Immature Granulocytes: 1 %
Lymphocytes Relative: 4 %
Lymphs Abs: 0.7 10*3/uL (ref 0.7–4.0)
MCH: 30.4 pg (ref 26.0–34.0)
MCHC: 30.7 g/dL (ref 30.0–36.0)
MCV: 99.2 fL (ref 80.0–100.0)
Monocytes Absolute: 0.5 10*3/uL (ref 0.1–1.0)
Monocytes Relative: 3 %
Neutro Abs: 15 10*3/uL — ABNORMAL HIGH (ref 1.7–7.7)
Neutrophils Relative %: 92 %
Platelets: 125 10*3/uL — ABNORMAL LOW (ref 150–400)
RBC: 3.55 MIL/uL — ABNORMAL LOW (ref 3.87–5.11)
RDW: 13.5 % (ref 11.5–15.5)
WBC: 16.4 10*3/uL — ABNORMAL HIGH (ref 4.0–10.5)
nRBC: 0 % (ref 0.0–0.2)

## 2021-04-30 LAB — COMPREHENSIVE METABOLIC PANEL
ALT: 17 U/L (ref 0–44)
ALT: 18 U/L (ref 0–44)
ALT: 19 U/L (ref 0–44)
AST: 30 U/L (ref 15–41)
AST: 31 U/L (ref 15–41)
AST: 31 U/L (ref 15–41)
Albumin: 1.6 g/dL — ABNORMAL LOW (ref 3.5–5.0)
Albumin: 1.5 g/dL — ABNORMAL LOW (ref 3.5–5.0)
Albumin: 2.3 g/dL — ABNORMAL LOW (ref 3.5–5.0)
Alkaline Phosphatase: 91 U/L (ref 38–126)
Alkaline Phosphatase: 93 U/L (ref 38–126)
Alkaline Phosphatase: 71 U/L (ref 38–126)
Anion gap: 13 (ref 5–15)
Anion gap: 15 (ref 5–15)
Anion gap: 16 — ABNORMAL HIGH (ref 5–15)
BUN: 36 mg/dL — ABNORMAL HIGH (ref 6–20)
BUN: 38 mg/dL — ABNORMAL HIGH (ref 6–20)
BUN: 41 mg/dL — ABNORMAL HIGH (ref 6–20)
CO2: 19 mmol/L — ABNORMAL LOW (ref 22–32)
CO2: 17 mmol/L — ABNORMAL LOW (ref 22–32)
CO2: 19 mmol/L — ABNORMAL LOW (ref 22–32)
Calcium: 8.2 mg/dL — ABNORMAL LOW (ref 8.9–10.3)
Calcium: 8.1 mg/dL — ABNORMAL LOW (ref 8.9–10.3)
Calcium: 8.2 mg/dL — ABNORMAL LOW (ref 8.9–10.3)
Chloride: 122 mmol/L — ABNORMAL HIGH (ref 98–111)
Chloride: 124 mmol/L — ABNORMAL HIGH (ref 98–111)
Chloride: 124 mmol/L — ABNORMAL HIGH (ref 98–111)
Creatinine, Ser: 3.83 mg/dL — ABNORMAL HIGH (ref 0.44–1.00)
Creatinine, Ser: 3.85 mg/dL — ABNORMAL HIGH (ref 0.44–1.00)
Creatinine, Ser: 3.99 mg/dL — ABNORMAL HIGH (ref 0.44–1.00)
GFR, Estimated: 14 mL/min — ABNORMAL LOW (ref >60)
GFR, Estimated: 14 mL/min — ABNORMAL LOW (ref >60)
GFR, Estimated: 14 mL/min — ABNORMAL LOW (ref >60)
Glucose, Bld: 189 mg/dL — ABNORMAL HIGH (ref 70–99)
Glucose, Bld: 239 mg/dL — ABNORMAL HIGH (ref 70–99)
Glucose, Bld: 233 mg/dL — ABNORMAL HIGH (ref 70–99)
Potassium: 5.7 mmol/L — ABNORMAL HIGH (ref 3.5–5.1)
Potassium: 5.4 mmol/L — ABNORMAL HIGH (ref 3.5–5.1)
Potassium: 3.9 mmol/L (ref 3.5–5.1)
Sodium: 154 mmol/L — ABNORMAL HIGH (ref 135–145)
Sodium: 156 mmol/L — ABNORMAL HIGH (ref 135–145)
Sodium: 159 mmol/L — ABNORMAL HIGH (ref 135–145)
Total Bilirubin: 1.1 mg/dL (ref 0.3–1.2)
Total Bilirubin: 1 mg/dL (ref 0.3–1.2)
Total Bilirubin: 1 mg/dL (ref 0.3–1.2)
Total Protein: 5.8 g/dL — ABNORMAL LOW (ref 6.5–8.1)
Total Protein: 5.8 g/dL — ABNORMAL LOW (ref 6.5–8.1)
Total Protein: 6 g/dL — ABNORMAL LOW (ref 6.5–8.1)

## 2021-04-30 LAB — CBC
HCT: 36.4 % (ref 36.0–46.0)
HCT: 29.5 % — ABNORMAL LOW (ref 36.0–46.0)
Hemoglobin: 11.4 g/dL — ABNORMAL LOW (ref 12.0–15.0)
Hemoglobin: 9.2 g/dL — ABNORMAL LOW (ref 12.0–15.0)
MCH: 30.5 pg (ref 26.0–34.0)
MCH: 30.3 pg (ref 26.0–34.0)
MCHC: 31.3 g/dL (ref 30.0–36.0)
MCHC: 31.2 g/dL (ref 30.0–36.0)
MCV: 97.3 fL (ref 80.0–100.0)
MCV: 97 fL (ref 80.0–100.0)
Platelets: 125 10*3/uL — ABNORMAL LOW (ref 150–400)
Platelets: 115 10*3/uL — ABNORMAL LOW (ref 150–400)
RBC: 3.74 MIL/uL — ABNORMAL LOW (ref 3.87–5.11)
RBC: 3.04 MIL/uL — ABNORMAL LOW (ref 3.87–5.11)
RDW: 13.6 % (ref 11.5–15.5)
RDW: 13.6 % (ref 11.5–15.5)
WBC: 15.5 10*3/uL — ABNORMAL HIGH (ref 4.0–10.5)
WBC: 14.2 10*3/uL — ABNORMAL HIGH (ref 4.0–10.5)
nRBC: 0.1 % (ref 0.0–0.2)
nRBC: 0 % (ref 0.0–0.2)

## 2021-04-30 LAB — POCT I-STAT 7, (LYTES, BLD GAS, ICA,H+H)
Acid-Base Excess: 0 mmol/L (ref 0.0–2.0)
Acid-base deficit: 7 mmol/L — ABNORMAL HIGH (ref 0.0–2.0)
Acid-base deficit: 2 mmol/L (ref 0.0–2.0)
Bicarbonate: 18.2 mmol/L — ABNORMAL LOW (ref 20.0–28.0)
Bicarbonate: 21 mmol/L (ref 20.0–28.0)
Bicarbonate: 22.2 mmol/L (ref 20.0–28.0)
Calcium, Ion: 1.11 mmol/L — ABNORMAL LOW (ref 1.15–1.40)
Calcium, Ion: 1.06 mmol/L — ABNORMAL LOW (ref 1.15–1.40)
Calcium, Ion: 1.16 mmol/L (ref 1.15–1.40)
HCT: 31 % — ABNORMAL LOW (ref 36.0–46.0)
HCT: 36 % (ref 36.0–46.0)
HCT: 27 % — ABNORMAL LOW (ref 36.0–46.0)
Hemoglobin: 10.5 g/dL — ABNORMAL LOW (ref 12.0–15.0)
Hemoglobin: 12.2 g/dL (ref 12.0–15.0)
Hemoglobin: 9.2 g/dL — ABNORMAL LOW (ref 12.0–15.0)
O2 Saturation: 99 %
O2 Saturation: 100 %
O2 Saturation: 100 %
Patient temperature: 98.1
Patient temperature: 97.8
Patient temperature: 97.8
Potassium: 5 mmol/L (ref 3.5–5.1)
Potassium: 3.8 mmol/L (ref 3.5–5.1)
Potassium: 3.1 mmol/L — ABNORMAL LOW (ref 3.5–5.1)
Sodium: 156 mmol/L — ABNORMAL HIGH (ref 135–145)
Sodium: 160 mmol/L — ABNORMAL HIGH (ref 135–145)
Sodium: 165 mmol/L (ref 135–145)
TCO2: 19 mmol/L — ABNORMAL LOW (ref 22–32)
TCO2: 22 mmol/L (ref 22–32)
TCO2: 23 mmol/L (ref 22–32)
pCO2 arterial: 34 mmHg (ref 32.0–48.0)
pCO2 arterial: 28.6 mmHg — ABNORMAL LOW (ref 32.0–48.0)
pCO2 arterial: 26.8 mmHg — ABNORMAL LOW (ref 32.0–48.0)
pH, Arterial: 7.335 — ABNORMAL LOW (ref 7.350–7.450)
pH, Arterial: 7.474 — ABNORMAL HIGH (ref 7.350–7.450)
pH, Arterial: 7.526 — ABNORMAL HIGH (ref 7.350–7.450)
pO2, Arterial: 156 mmHg — ABNORMAL HIGH (ref 83.0–108.0)
pO2, Arterial: 277 mmHg — ABNORMAL HIGH (ref 83.0–108.0)
pO2, Arterial: 365 mmHg — ABNORMAL HIGH (ref 83.0–108.0)

## 2021-04-30 LAB — APTT
aPTT: 40 seconds — ABNORMAL HIGH (ref 24–36)
aPTT: 36 seconds (ref 24–36)
aPTT: 35 seconds (ref 24–36)

## 2021-04-30 LAB — LACTIC ACID, PLASMA
Lactic Acid, Venous: 6.8 mmol/L (ref 0.5–1.9)
Lactic Acid, Venous: 7.7 mmol/L (ref 0.5–1.9)

## 2021-04-30 LAB — LIPASE, BLOOD: Lipase: 20 U/L (ref 11–51)

## 2021-04-30 LAB — PHOSPHORUS
Phosphorus: 7.6 mg/dL — ABNORMAL HIGH (ref 2.5–4.6)
Phosphorus: 7 mg/dL — ABNORMAL HIGH (ref 2.5–4.6)
Phosphorus: 4.5 mg/dL (ref 2.5–4.6)

## 2021-04-30 LAB — PROTIME-INR
INR: 1.5 — ABNORMAL HIGH (ref 0.8–1.2)
INR: 1.5 — ABNORMAL HIGH (ref 0.8–1.2)
INR: 1.6 — ABNORMAL HIGH (ref 0.8–1.2)
Prothrombin Time: 18.3 seconds — ABNORMAL HIGH (ref 11.4–15.2)
Prothrombin Time: 18.5 seconds — ABNORMAL HIGH (ref 11.4–15.2)
Prothrombin Time: 18.9 seconds — ABNORMAL HIGH (ref 11.4–15.2)

## 2021-04-30 LAB — MAGNESIUM
Magnesium: 2.1 mg/dL (ref 1.7–2.4)
Magnesium: 2.3 mg/dL (ref 1.7–2.4)
Magnesium: 2.3 mg/dL (ref 1.7–2.4)

## 2021-04-30 LAB — AMYLASE: Amylase: 11 U/L — ABNORMAL LOW (ref 28–100)

## 2021-04-30 LAB — BILIRUBIN, DIRECT
Bilirubin, Direct: 0.4 mg/dL — ABNORMAL HIGH (ref 0.0–0.2)
Bilirubin, Direct: 0.5 mg/dL — ABNORMAL HIGH (ref 0.0–0.2)

## 2021-04-30 MED ORDER — SODIUM BICARBONATE 8.4 % IV SOLN
100.0000 meq | Freq: Once | INTRAVENOUS | Status: AC
  Administered 2021-04-30: 100 meq via INTRAVENOUS
  Filled 2021-04-30: qty 100

## 2021-04-30 MED ORDER — ALBUMIN HUMAN 25 % IV SOLN
25.0000 g | Freq: Once | INTRAVENOUS | Status: AC
  Administered 2021-04-30: 25 g via INTRAVENOUS
  Filled 2021-04-30: qty 100

## 2021-04-30 NOTE — Progress Notes (Signed)
Pts vent settings changed due to ABG results per the donor coordinator Lawson Fiscal. RT will contact Elink

## 2021-04-30 NOTE — Anesthesia Preprocedure Evaluation (Addendum)
Anesthesia Evaluation  Patient identified by MRN, date of birth, ID band Patient unresponsive    Reviewed: Allergy & Precautions, Patient's Chart, lab work & pertinent test results, Unable to perform ROS - Chart review only  Airway Mallampati: Intubated       Dental no notable dental hx.    Pulmonary neg pulmonary ROS,     + decreased breath sounds      Cardiovascular negative cardio ROS   Rhythm:Regular Rate:Normal     Neuro/Psych negative neurological ROS     GI/Hepatic negative GI ROS, Neg liver ROS,   Endo/Other  negative endocrine ROS  Renal/GU negative Renal ROS     Musculoskeletal negative musculoskeletal ROS (+)   Abdominal Normal abdominal exam  (+)   Peds  Hematology negative hematology ROS (+)   Anesthesia Other Findings   Reproductive/Obstetrics                            Anesthesia Physical Anesthesia Plan  ASA: 6  Anesthesia Plan: General   Post-op Pain Management:    Induction: Intravenous  PONV Risk Score and Plan: 3 and Treatment may vary due to age or medical condition  Airway Management Planned: Oral ETT  Additional Equipment:   Intra-op Plan:   Post-operative Plan: Extubation in OR  Informed Consent:     History available from chart only  Plan Discussed with:   Anesthesia Plan Comments:        Anesthesia Quick Evaluation

## 2021-05-01 ENCOUNTER — Encounter (HOSPITAL_COMMUNITY): Payer: Self-pay | Admitting: Anesthesiology

## 2021-05-01 ENCOUNTER — Inpatient Hospital Stay (HOSPITAL_COMMUNITY): Payer: Self-pay | Admitting: Anesthesiology

## 2021-05-01 ENCOUNTER — Encounter (HOSPITAL_COMMUNITY): Admission: EM | Disposition: E | Payer: Self-pay | Source: Home / Self Care | Attending: Internal Medicine

## 2021-05-01 ENCOUNTER — Other Ambulatory Visit (HOSPITAL_COMMUNITY): Payer: Self-pay

## 2021-05-01 HISTORY — PX: ORGAN PROCUREMENT: SHX5270

## 2021-05-01 LAB — PROTIME-INR
INR: 1.7 — ABNORMAL HIGH (ref 0.8–1.2)
INR: 1.7 — ABNORMAL HIGH (ref 0.8–1.2)
INR: 1.7 — ABNORMAL HIGH (ref 0.8–1.2)
Prothrombin Time: 19.5 seconds — ABNORMAL HIGH (ref 11.4–15.2)
Prothrombin Time: 19.9 seconds — ABNORMAL HIGH (ref 11.4–15.2)
Prothrombin Time: 20.2 seconds — ABNORMAL HIGH (ref 11.4–15.2)

## 2021-05-01 LAB — COMPREHENSIVE METABOLIC PANEL
ALT: 18 U/L (ref 0–44)
ALT: 18 U/L (ref 0–44)
ALT: 19 U/L (ref 0–44)
AST: 22 U/L (ref 15–41)
AST: 26 U/L (ref 15–41)
AST: 32 U/L (ref 15–41)
Albumin: 1.8 g/dL — ABNORMAL LOW (ref 3.5–5.0)
Albumin: 1.9 g/dL — ABNORMAL LOW (ref 3.5–5.0)
Albumin: 2.1 g/dL — ABNORMAL LOW (ref 3.5–5.0)
Alkaline Phosphatase: 88 U/L (ref 38–126)
Alkaline Phosphatase: 92 U/L (ref 38–126)
Alkaline Phosphatase: 96 U/L (ref 38–126)
Anion gap: 10 (ref 5–15)
Anion gap: 10 (ref 5–15)
Anion gap: 15 (ref 5–15)
BUN: 44 mg/dL — ABNORMAL HIGH (ref 6–20)
BUN: 44 mg/dL — ABNORMAL HIGH (ref 6–20)
BUN: 44 mg/dL — ABNORMAL HIGH (ref 6–20)
CO2: 20 mmol/L — ABNORMAL LOW (ref 22–32)
CO2: 23 mmol/L (ref 22–32)
CO2: 23 mmol/L (ref 22–32)
Calcium: 8.2 mg/dL — ABNORMAL LOW (ref 8.9–10.3)
Calcium: 8.3 mg/dL — ABNORMAL LOW (ref 8.9–10.3)
Calcium: 8.6 mg/dL — ABNORMAL LOW (ref 8.9–10.3)
Chloride: 125 mmol/L — ABNORMAL HIGH (ref 98–111)
Chloride: 127 mmol/L — ABNORMAL HIGH (ref 98–111)
Chloride: 128 mmol/L — ABNORMAL HIGH (ref 98–111)
Creatinine, Ser: 3.4 mg/dL — ABNORMAL HIGH (ref 0.44–1.00)
Creatinine, Ser: 3.56 mg/dL — ABNORMAL HIGH (ref 0.44–1.00)
Creatinine, Ser: 3.81 mg/dL — ABNORMAL HIGH (ref 0.44–1.00)
GFR, Estimated: 14 mL/min — ABNORMAL LOW (ref >60)
GFR, Estimated: 16 mL/min — ABNORMAL LOW (ref >60)
GFR, Estimated: 17 mL/min — ABNORMAL LOW (ref >60)
Glucose, Bld: 241 mg/dL — ABNORMAL HIGH (ref 70–99)
Glucose, Bld: 300 mg/dL — ABNORMAL HIGH (ref 70–99)
Glucose, Bld: 304 mg/dL — ABNORMAL HIGH (ref 70–99)
Potassium: 3.2 mmol/L — ABNORMAL LOW (ref 3.5–5.1)
Potassium: 3.4 mmol/L — ABNORMAL LOW (ref 3.5–5.1)
Potassium: 3.6 mmol/L (ref 3.5–5.1)
Sodium: 158 mmol/L — ABNORMAL HIGH (ref 135–145)
Sodium: 160 mmol/L — ABNORMAL HIGH (ref 135–145)
Sodium: 163 mmol/L (ref 135–145)
Total Bilirubin: 0.6 mg/dL (ref 0.3–1.2)
Total Bilirubin: 0.7 mg/dL (ref 0.3–1.2)
Total Bilirubin: 1.1 mg/dL (ref 0.3–1.2)
Total Protein: 5.2 g/dL — ABNORMAL LOW (ref 6.5–8.1)
Total Protein: 5.3 g/dL — ABNORMAL LOW (ref 6.5–8.1)
Total Protein: 5.7 g/dL — ABNORMAL LOW (ref 6.5–8.1)

## 2021-05-01 LAB — SURGICAL PCR SCREEN
MRSA, PCR: NEGATIVE
Staphylococcus aureus: POSITIVE — AB

## 2021-05-01 LAB — URINE CULTURE: Culture: >=100000 — AB

## 2021-05-01 LAB — URINALYSIS, ROUTINE W REFLEX MICROSCOPIC
Bilirubin Urine: NEGATIVE
Glucose, UA: 50 mg/dL — AB
Ketones, ur: NEGATIVE mg/dL
Nitrite: NEGATIVE
Protein, ur: NEGATIVE mg/dL
Specific Gravity, Urine: 1.006 (ref 1.005–1.030)
pH: 5 (ref 5.0–8.0)

## 2021-05-01 LAB — LACTIC ACID, PLASMA
Lactic Acid, Venous: 3 mmol/L (ref 0.5–1.9)
Lactic Acid, Venous: 3.6 mmol/L (ref 0.5–1.9)
Lactic Acid, Venous: 3.8 mmol/L (ref 0.5–1.9)
Lactic Acid, Venous: 6.8 mmol/L (ref 0.5–1.9)

## 2021-05-01 LAB — POTASSIUM: Potassium: 3.3 mmol/L — ABNORMAL LOW (ref 3.5–5.1)

## 2021-05-01 LAB — MAGNESIUM
Magnesium: 2.3 mg/dL (ref 1.7–2.4)
Magnesium: 2.4 mg/dL (ref 1.7–2.4)
Magnesium: 2.5 mg/dL — ABNORMAL HIGH (ref 1.7–2.4)

## 2021-05-01 LAB — POCT I-STAT 7, (LYTES, BLD GAS, ICA,H+H)
Acid-base deficit: 2 mmol/L (ref 0.0–2.0)
Acid-base deficit: 2 mmol/L (ref 0.0–2.0)
Acid-base deficit: 3 mmol/L — ABNORMAL HIGH (ref 0.0–2.0)
Bicarbonate: 22.7 mmol/L (ref 20.0–28.0)
Bicarbonate: 22.8 mmol/L (ref 20.0–28.0)
Bicarbonate: 24 mmol/L (ref 20.0–28.0)
Calcium, Ion: 1.19 mmol/L (ref 1.15–1.40)
Calcium, Ion: 1.2 mmol/L (ref 1.15–1.40)
Calcium, Ion: 1.21 mmol/L (ref 1.15–1.40)
HCT: 24 % — ABNORMAL LOW (ref 36.0–46.0)
HCT: 25 % — ABNORMAL LOW (ref 36.0–46.0)
HCT: 25 % — ABNORMAL LOW (ref 36.0–46.0)
Hemoglobin: 8.2 g/dL — ABNORMAL LOW (ref 12.0–15.0)
Hemoglobin: 8.5 g/dL — ABNORMAL LOW (ref 12.0–15.0)
Hemoglobin: 8.5 g/dL — ABNORMAL LOW (ref 12.0–15.0)
O2 Saturation: 98 %
O2 Saturation: 99 %
O2 Saturation: 99 %
Patient temperature: 97.8
Patient temperature: 97.8
Potassium: 3.2 mmol/L — ABNORMAL LOW (ref 3.5–5.1)
Potassium: 3.3 mmol/L — ABNORMAL LOW (ref 3.5–5.1)
Potassium: 3.4 mmol/L — ABNORMAL LOW (ref 3.5–5.1)
Sodium: 162 mmol/L (ref 135–145)
Sodium: 162 mmol/L (ref 135–145)
Sodium: 166 mmol/L (ref 135–145)
TCO2: 24 mmol/L (ref 22–32)
TCO2: 24 mmol/L (ref 22–32)
TCO2: 25 mmol/L (ref 22–32)
pCO2 arterial: 36.6 mmHg (ref 32.0–48.0)
pCO2 arterial: 40.8 mmHg (ref 32.0–48.0)
pCO2 arterial: 44.3 mmHg (ref 32.0–48.0)
pH, Arterial: 7.339 — ABNORMAL LOW (ref 7.350–7.450)
pH, Arterial: 7.353 (ref 7.350–7.450)
pH, Arterial: 7.402 (ref 7.350–7.450)
pO2, Arterial: 111 mmHg — ABNORMAL HIGH (ref 83.0–108.0)
pO2, Arterial: 121 mmHg — ABNORMAL HIGH (ref 83.0–108.0)
pO2, Arterial: 171 mmHg — ABNORMAL HIGH (ref 83.0–108.0)

## 2021-05-01 LAB — CBC
HCT: 26.6 % — ABNORMAL LOW (ref 36.0–46.0)
Hemoglobin: 8.6 g/dL — ABNORMAL LOW (ref 12.0–15.0)
MCH: 31.5 pg (ref 26.0–34.0)
MCHC: 32.3 g/dL (ref 30.0–36.0)
MCV: 97.4 fL (ref 80.0–100.0)
Platelets: 90 10*3/uL — ABNORMAL LOW (ref 150–400)
RBC: 2.73 MIL/uL — ABNORMAL LOW (ref 3.87–5.11)
RDW: 13.6 % (ref 11.5–15.5)
WBC: 11.3 10*3/uL — ABNORMAL HIGH (ref 4.0–10.5)
nRBC: 0.4 % — ABNORMAL HIGH (ref 0.0–0.2)

## 2021-05-01 LAB — AMYLASE: Amylase: 12 U/L — ABNORMAL LOW (ref 28–100)

## 2021-05-01 LAB — GLUCOSE, CAPILLARY
Glucose-Capillary: 279 mg/dL — ABNORMAL HIGH (ref 70–99)
Glucose-Capillary: 262 mg/dL — ABNORMAL HIGH (ref 70–99)

## 2021-05-01 LAB — BILIRUBIN, DIRECT
Bilirubin, Direct: 0.3 mg/dL — ABNORMAL HIGH (ref 0.0–0.2)
Bilirubin, Direct: 0.3 mg/dL — ABNORMAL HIGH (ref 0.0–0.2)
Bilirubin, Direct: 0.5 mg/dL — ABNORMAL HIGH (ref 0.0–0.2)

## 2021-05-01 LAB — APTT
aPTT: 37 seconds — ABNORMAL HIGH (ref 24–36)
aPTT: 38 seconds — ABNORMAL HIGH (ref 24–36)
aPTT: 39 seconds — ABNORMAL HIGH (ref 24–36)

## 2021-05-01 LAB — PHOSPHORUS
Phosphorus: 2.7 mg/dL (ref 2.5–4.6)
Phosphorus: 3.8 mg/dL (ref 2.5–4.6)
Phosphorus: 4.9 mg/dL — ABNORMAL HIGH (ref 2.5–4.6)

## 2021-05-01 LAB — LIPASE, BLOOD: Lipase: 22 U/L (ref 11–51)

## 2021-05-01 SURGERY — SURGICAL PROCUREMENT, ORGAN
Anesthesia: General | Site: Abdomen

## 2021-05-01 MED ORDER — ROCURONIUM BROMIDE 10 MG/ML (PF) SYRINGE
PREFILLED_SYRINGE | INTRAVENOUS | Status: DC | PRN
  Administered 2021-05-01: 100 mg via INTRAVENOUS

## 2021-05-01 MED ORDER — LABETALOL HCL 5 MG/ML IV SOLN
INTRAVENOUS | Status: AC
  Filled 2021-05-01: qty 4

## 2021-05-01 MED ORDER — ALBUMIN HUMAN 5 % IV SOLN: INTRAVENOUS | Status: DC | PRN

## 2021-05-01 MED ORDER — MUPIROCIN 2 % EX OINT
1.0000 "application " | TOPICAL_OINTMENT | Freq: Two times a day (BID) | CUTANEOUS | Status: DC
  Administered 2021-05-01: 1 via NASAL
  Filled 2021-05-01: qty 22

## 2021-05-01 MED ORDER — INSULIN ASPART 100 UNIT/ML IJ SOLN
0.0000 [IU] | INTRAMUSCULAR | Status: DC
  Administered 2021-05-01 (×2): 5 [IU] via SUBCUTANEOUS

## 2021-05-01 MED ORDER — DEXTROSE 5 % IV SOLN: INTRAVENOUS | Status: DC

## 2021-05-01 MED ORDER — POTASSIUM CHLORIDE 20 MEQ PO PACK
40.0000 meq | PACK | Freq: Once | ORAL | Status: AC
  Administered 2021-05-01: 40 meq
  Filled 2021-05-01: qty 2

## 2021-05-01 MED ORDER — LABETALOL HCL 5 MG/ML IV SOLN
INTRAVENOUS | Status: DC | PRN
  Administered 2021-05-01: 5 mg via INTRAVENOUS

## 2021-05-01 MED ORDER — 0.9 % SODIUM CHLORIDE (POUR BTL) OPTIME
TOPICAL | Status: DC | PRN
  Administered 2021-05-01: 1000 mL
  Administered 2021-05-01: 6000 mL

## 2021-05-01 MED ORDER — SODIUM CHLORIDE 0.45 % IV BOLUS
1000.0000 mL | Freq: Once | INTRAVENOUS | Status: AC
  Administered 2021-05-01: 1000 mL via INTRAVENOUS

## 2021-05-01 MED ORDER — SODIUM CHLORIDE 0.9 % IV SOLN
1000.0000 mg | Freq: Once | INTRAVENOUS | Status: AC
  Administered 2021-05-01: 1000 mg via INTRAVENOUS
  Filled 2021-05-01: qty 16

## 2021-05-01 MED ORDER — HEPARIN SODIUM (PORCINE) 1000 UNIT/ML IJ SOLN
INTRAMUSCULAR | Status: AC
  Filled 2021-05-01: qty 2

## 2021-05-01 MED ORDER — ROCURONIUM BROMIDE 10 MG/ML (PF) SYRINGE
PREFILLED_SYRINGE | INTRAVENOUS | Status: AC
  Filled 2021-05-01: qty 10

## 2021-05-01 MED ORDER — ARTIFICIAL TEARS OPHTHALMIC OINT
TOPICAL_OINTMENT | OPHTHALMIC | Status: DC | PRN
  Administered 2021-05-01: 1 via OPHTHALMIC

## 2021-05-01 MED ORDER — HEPARIN SODIUM (PORCINE) 1000 UNIT/ML IJ SOLN
INTRAMUSCULAR | Status: DC | PRN
  Administered 2021-05-01: 19000 [IU] via INTRAVENOUS

## 2021-05-01 MED ORDER — LACTATED RINGERS IV SOLN: INTRAVENOUS | Status: DC | PRN

## 2021-05-01 MED ORDER — ARTIFICIAL TEARS OPHTHALMIC OINT
TOPICAL_OINTMENT | OPHTHALMIC | Status: AC
  Filled 2021-05-01: qty 3.5

## 2021-05-01 SURGICAL SUPPLY — 74 items
BAG COUNTER SPONGE SURGICOUNT (BAG) ×2 IMPLANT
BLADE CLIPPER SURG (BLADE) ×2 IMPLANT
BLADE SAW STERNAL (BLADE) ×2 IMPLANT
BLADE SURG 10 STRL SS (BLADE) ×2 IMPLANT
CLEANER TIP ELECTROSURG 2X2 (MISCELLANEOUS) ×2 IMPLANT
CLIP TI MEDIUM 6 (CLIP) ×4 IMPLANT
CNTNR URN SCR LID CUP LEK RST (MISCELLANEOUS) ×1 IMPLANT
CONT SPEC 4OZ STRL OR WHT (MISCELLANEOUS) ×2
COVER BACK TABLE 60X90IN (DRAPES) IMPLANT
COVER SURGICAL LIGHT HANDLE (MISCELLANEOUS) ×2 IMPLANT
DRAPE HALF SHEET 40X57 (DRAPES) ×4 IMPLANT
DRAPE SLUSH MACHINE 52X66 (DRAPES) ×2 IMPLANT
DRSG COVADERM 4X10 (GAUZE/BANDAGES/DRESSINGS) ×4 IMPLANT
DRSG COVADERM 4X14 (GAUZE/BANDAGES/DRESSINGS) ×2 IMPLANT
DRSG TELFA 3X8 NADH (GAUZE/BANDAGES/DRESSINGS) ×2 IMPLANT
DURAPREP 26ML APPLICATOR (WOUND CARE) ×4 IMPLANT
ELECT BLADE 6.5 EXT (BLADE) ×4 IMPLANT
ELECT REM PT RETURN 9FT ADLT (ELECTROSURGICAL) ×4
ELECTRODE REM PT RTRN 9FT ADLT (ELECTROSURGICAL) ×2 IMPLANT
GAUZE 4X4 16PLY ~~LOC~~+RFID DBL (SPONGE) ×2 IMPLANT
GLOVE SRG 8 PF TXTR STRL LF DI (GLOVE) IMPLANT
GLOVE SURG ENC MOIS LTX SZ7 (GLOVE) ×2 IMPLANT
GLOVE SURG ENC MOIS LTX SZ7.5 (GLOVE) ×10 IMPLANT
GLOVE SURG ENC MOIS LTX SZ8 (GLOVE) IMPLANT
GLOVE SURG ENC MOIS LTX SZ8.5 (GLOVE) IMPLANT
GLOVE SURG POLYISO LF SZ7 (GLOVE) ×4 IMPLANT
GLOVE SURG POLYISO LF SZ7.5 (GLOVE) ×8 IMPLANT
GLOVE SURG POLYISO LF SZ8 (GLOVE) IMPLANT
GLOVE SURG UNDER POLY LF SZ7 (GLOVE) ×4 IMPLANT
GLOVE SURG UNDER POLY LF SZ7.5 (GLOVE) ×4 IMPLANT
GLOVE SURG UNDER POLY LF SZ8 (GLOVE)
GLOVE SURG UNDER POLY LF SZ8.5 (GLOVE) IMPLANT
GOWN STRL REUS W/ TWL LRG LVL3 (GOWN DISPOSABLE) ×4 IMPLANT
GOWN STRL REUS W/ TWL XL LVL3 (GOWN DISPOSABLE) ×4 IMPLANT
GOWN STRL REUS W/TWL LRG LVL3 (GOWN DISPOSABLE) ×8
GOWN STRL REUS W/TWL XL LVL3 (GOWN DISPOSABLE) ×8
HANDLE SUCTION POOLE (INSTRUMENTS) ×2 IMPLANT
IV CATH 18GX1.25 SAFE RETR GRN (IV SOLUTION) ×2 IMPLANT
KIT POST MORTEM ADULT 36X90 (BAG) ×2 IMPLANT
KIT TURNOVER KIT B (KITS) ×2 IMPLANT
LOOP VESSEL MAXI BLUE (MISCELLANEOUS) ×2 IMPLANT
LOOP VESSEL MINI RED (MISCELLANEOUS) ×2 IMPLANT
MANIFOLD NEPTUNE II (INSTRUMENTS) ×2 IMPLANT
NEEDLE BIOPSY 14X6 SOFT TISS (NEEDLE) IMPLANT
NS IRRIG 1000ML POUR BTL (IV SOLUTION) ×14 IMPLANT
PACK AORTA (CUSTOM PROCEDURE TRAY) ×2 IMPLANT
PENCIL BUTTON HOLSTER BLD 10FT (ELECTRODE) ×6 IMPLANT
SPONGE INTESTINAL PEANUT (DISPOSABLE) ×2 IMPLANT
SPONGE T-LAP 18X18 ~~LOC~~+RFID (SPONGE) ×8 IMPLANT
STAPLER VISISTAT 35W (STAPLE) ×2 IMPLANT
SUCTION POOLE HANDLE (INSTRUMENTS) ×4
SUT ETHILON 1 LR 30 (SUTURE) ×6 IMPLANT
SUT ETHILON 2 LR (SUTURE) ×4 IMPLANT
SUT PROLENE 3 0 RB 1 (SUTURE) IMPLANT
SUT PROLENE 3 0 SH 1 (SUTURE) IMPLANT
SUT PROLENE 4 0 RB 1 (SUTURE)
SUT PROLENE 4-0 RB1 .5 CRCL 36 (SUTURE) IMPLANT
SUT PROLENE 5 0 C 1 24 (SUTURE) IMPLANT
SUT PROLENE 6 0 BV (SUTURE) IMPLANT
SUT SILK 0 TIES 10X30 (SUTURE) IMPLANT
SUT SILK 1 SH (SUTURE) IMPLANT
SUT SILK 1 TIES 10X30 (SUTURE) IMPLANT
SUT SILK 2 0 (SUTURE)
SUT SILK 2 0 SH (SUTURE) IMPLANT
SUT SILK 2 0 SH CR/8 (SUTURE) IMPLANT
SUT SILK 2 0 TIES 10X30 (SUTURE) ×2 IMPLANT
SUT SILK 2-0 18XBRD TIE 12 (SUTURE) IMPLANT
SUT SILK 3 0 SH CR/8 (SUTURE) ×2 IMPLANT
SUT SILK 3 0 TIES 10X30 (SUTURE) ×2 IMPLANT
SYR 50ML LL SCALE MARK (SYRINGE) IMPLANT
TAPE UMBILICAL 1/8 X36 TWILL (MISCELLANEOUS) ×2 IMPLANT
TUBE CONNECTING 12X1/4 (SUCTIONS) ×6 IMPLANT
WATER STERILE IRR 1000ML POUR (IV SOLUTION) IMPLANT
YANKAUER SUCT BULB TIP NO VENT (SUCTIONS) ×4 IMPLANT

## 2021-05-01 NOTE — OR Nursing (Signed)
Cross clamp applied at 1725 Liver removed at 1750 Right Kidney removed at 1756 Kidney biopsy sent to pathology 1806

## 2021-05-01 NOTE — Progress Notes (Signed)
eLink Physician-Brief Progress Note Patient Name: Nikyah Lackman DOB: September 16, 1980 MRN: [id number]   Date of Service  05/04/2021  HPI/Events of Note  Hypernatremia - Na+ = 163. Donor services request 1 Liter 0.45 NaCl IV bolus and D5W IV infusion at 100 mL/hour.   eICU Interventions  Plan: D/C LR IV infusion. 0.45 NaCl 1 liter IV over 1 hour now. D5W IV infusion at 100 mL/hour.     Intervention Category Major Interventions: Electrolyte abnormality - evaluation and management  Samaia Iwata Eugene 05/21/2021, 12:58 AM

## 2021-05-01 NOTE — Transfer of Care (Signed)
Immediate Anesthesia Transfer of Care Note  Patient: Brandy Pugh  Procedure(s) Performed: ORGAN PROCUREMENT - Liver & Right Kidney

## 2021-05-01 NOTE — Anesthesia Postprocedure Evaluation (Signed)
Anesthesia Post Note  Patient: Brandy Pugh  Procedure(s) Performed: ORGAN PROCUREMENT - Liver & Right Kidney (Abdomen)     Anesthesia Type: General Comments: Brain dead organ donor case   No notable events documented.  Last Vitals:  Vitals:   May 30, 2021 1400 05-30-2021 1500  BP: 112/85 116/90  Pulse: 82 80  Resp: (!) 24 (!) 24  Temp:    SpO2: 95% 95%    Last Pain:  Vitals:   05-30-21 1200  TempSrc: Axillary                 Earl Lites P Tequlia Gonsalves

## 2021-05-02 LAB — CULTURE, RESPIRATORY W GRAM STAIN: Gram Stain: NONE SEEN

## 2021-05-03 ENCOUNTER — Encounter (HOSPITAL_COMMUNITY): Payer: Self-pay

## 2021-05-03 LAB — SURGICAL PATHOLOGY

## 2021-05-04 LAB — CULTURE, BLOOD (ROUTINE X 2)
Culture: NO GROWTH
Culture: NO GROWTH
Special Requests: ADEQUATE
Special Requests: ADEQUATE

## 2021-05-04 LAB — SURGICAL PATHOLOGY
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
## 2021-05-27 DEATH — deceased

## 2022-10-24 IMAGING — NM NM BRAIN 4+V W/ FLOW
7 series · 12 of 12 positions shown · non-contrast
Comparison: CT head 04/25/2021

CLINICAL DATA: Suspect brain death

EXAM:
NM BRAIN SCAN WITH FLOW - 4+ VIEW
TECHNIQUE: Radionuclide angiogram and static images of the brain were obtained
after intravenous injection of radiopharmaceutical.
RADIOPHARMACEUTICALS:  20.9 millicuries of technetium 99 M Ceretec,
IV

[br brain · 2.26mm/px · 1 of 1 slices shown (1 of 7)]
[im 1/1]
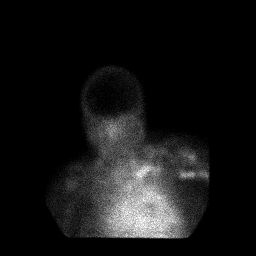

[br brain · 2.26mm/px · 1 of 1 slices shown (2 of 7)]
[im 1/1]
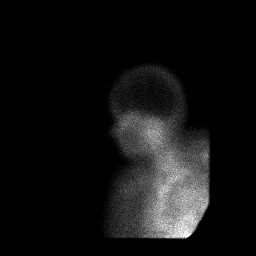

[br brain · 2.26mm/px · 1 of 1 slices shown (3 of 7)]
[im 1/1]
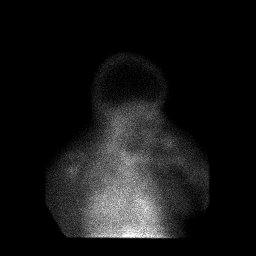

[br brain · 2.26mm/px · 1 of 1 slices shown (4 of 7)]
[im 1/1]
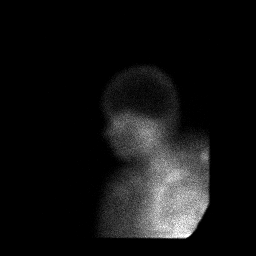

[br brain · 2.26mm/px · 1 of 1 slices shown (5 of 7)]
[im 1/1]
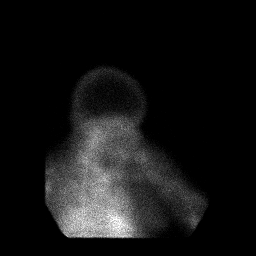

[br brain · 4.52mm/px · 6 of 30 frames shown (6 of 7)]
[frame 3/30]
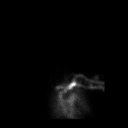
[frame 8/30]
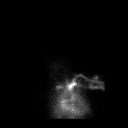
[frame 13/30]
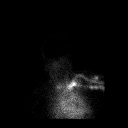
[frame 18/30]
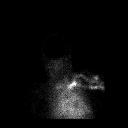
[frame 23/30]
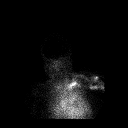
[frame 28/30]
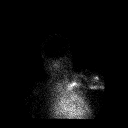

[br brain · 2.26mm/px · 1 of 1 slices shown (7 of 7)]
[im 1/1]
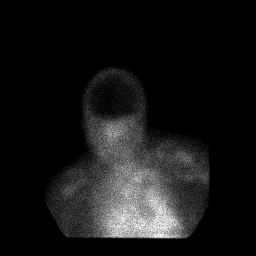

[12 of 12 positions shown; findings below may reference images not displayed]

FINDINGS: On the flow phase portion of the exam there is no significant
perfusion to the cerebral hemispheres. On the 15 minute delay planar
images no radiotracer accumulation identified within the cerebral or
cerebellar hemispheres.
IMPRESSION: 1. There is absent blood flow to the cerebral hemispheres. On
delayed imaging there is no radiotracer accumulation within the
cerebellar or cerebral hemispheres. Imaging findings corroborate the
suspected clinical history of brain death.
2. Critical Value/emergent results were called by telephone at the
time of interpretation on 04/28/2021 at [DATE] to provider BROW
GIORGI , who verbally acknowledged these results.

## 2022-10-25 IMAGING — US US BIOPSY CORE LIVER
1 series · 9 of 9 positions shown · non-contrast
Comparison: none

INDICATION: 40-year-old female undergoing workup for possible organ donation.

[Series 1: us biopsy (liver) · 9 of 9 slices shown]
[im 1/9]
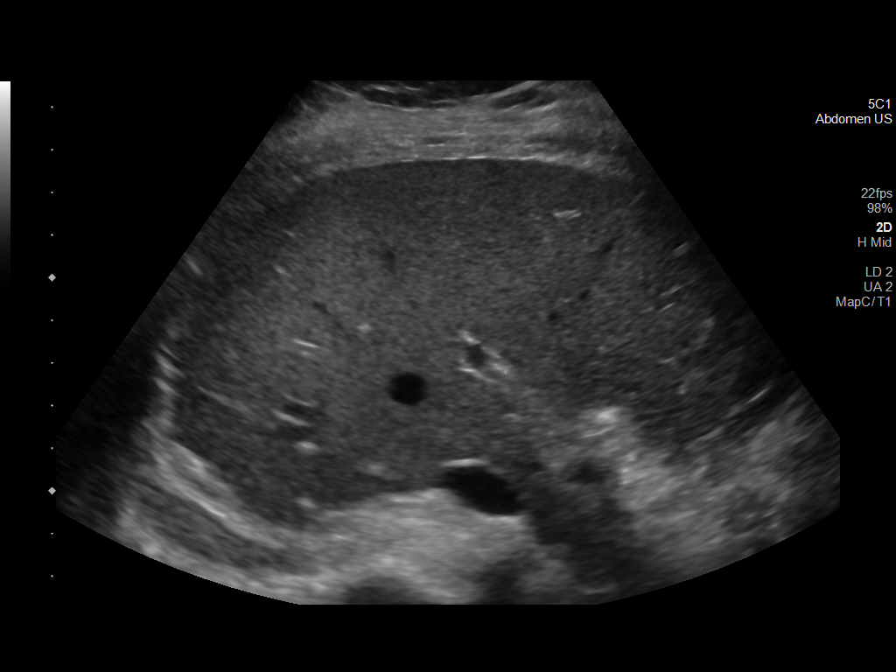
[im 2/9]
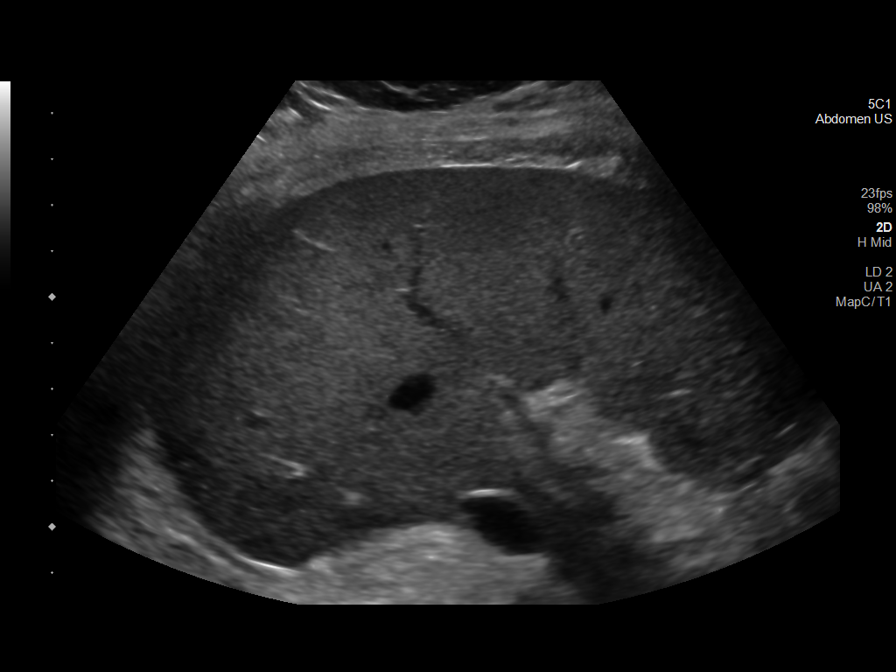
[im 3/9]
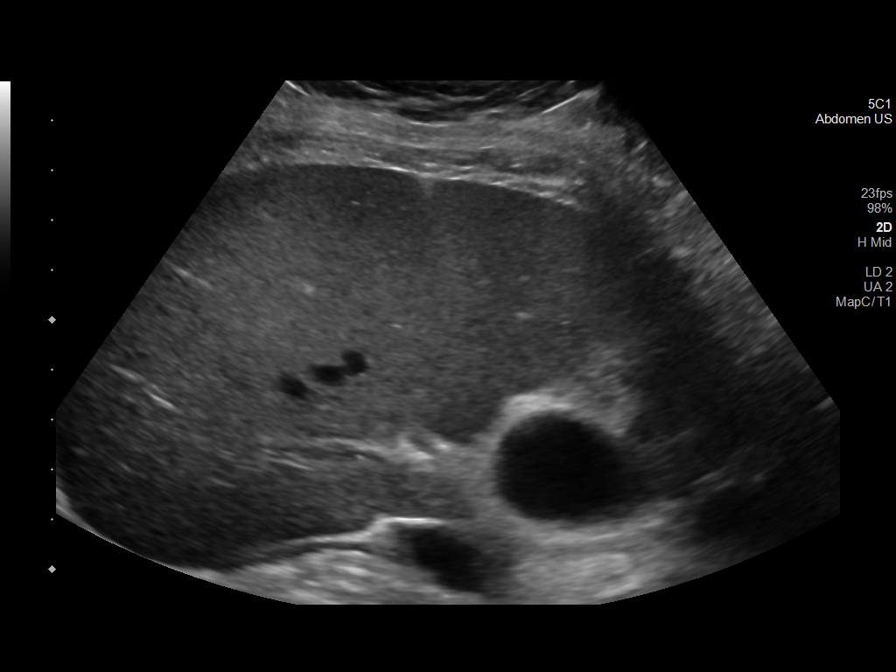
[im 4/9]
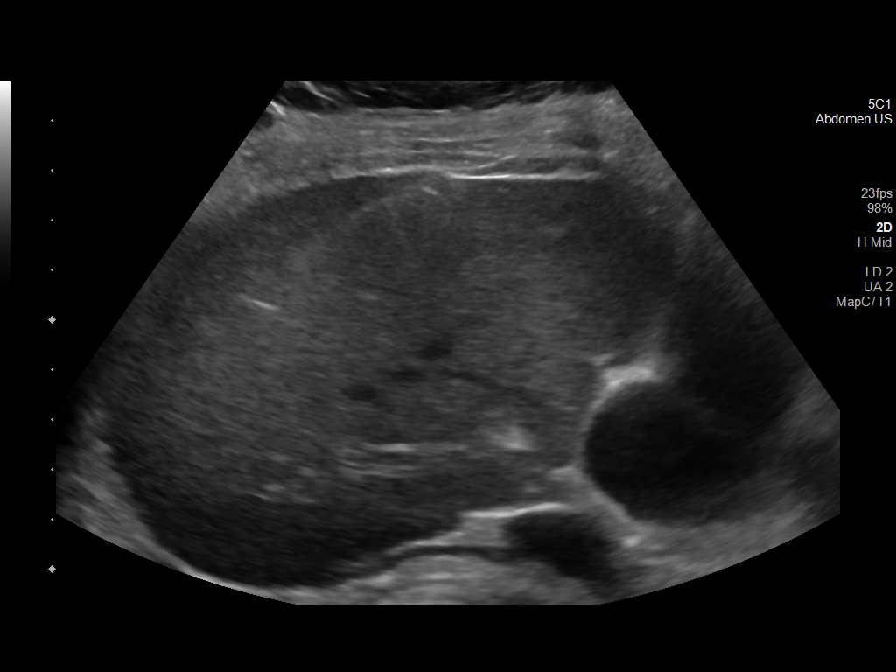
[im 5/9]
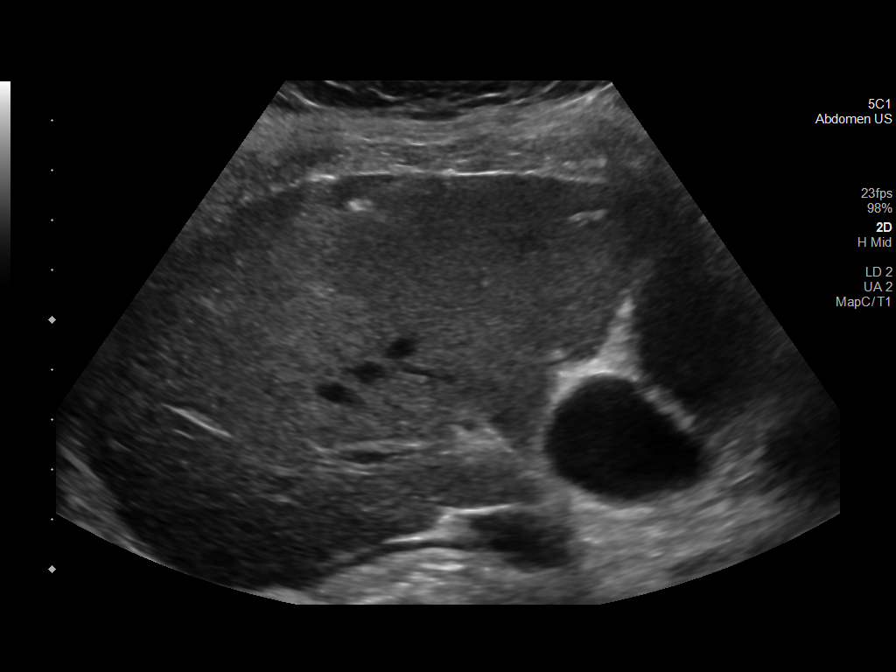
[im 6/9]
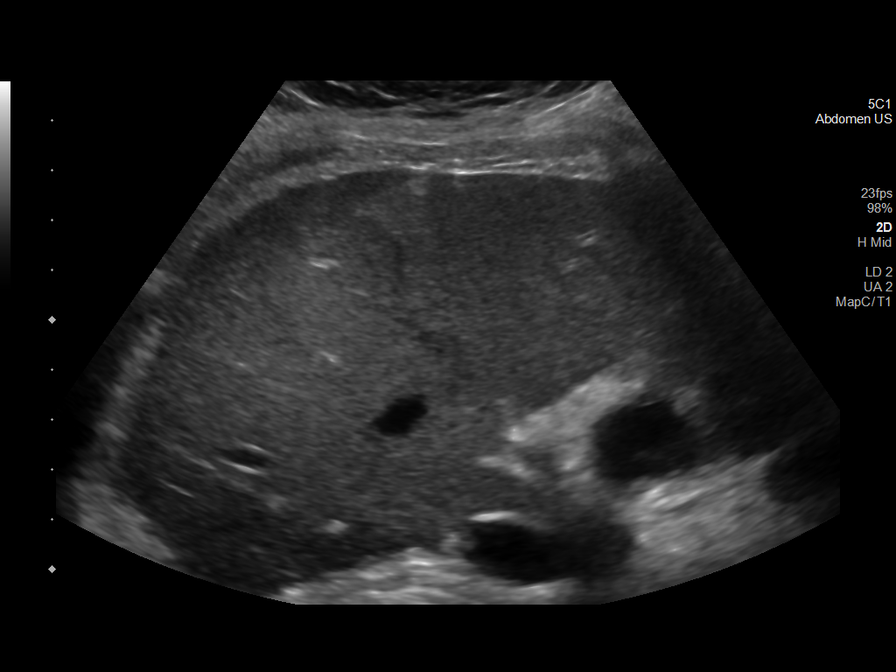
[im 7/9]
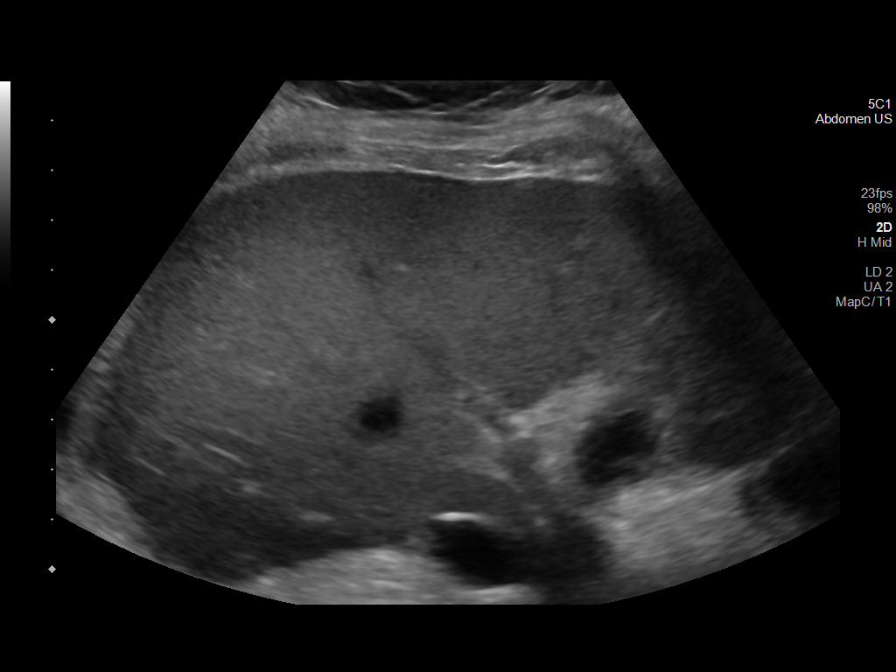
[im 8/9]
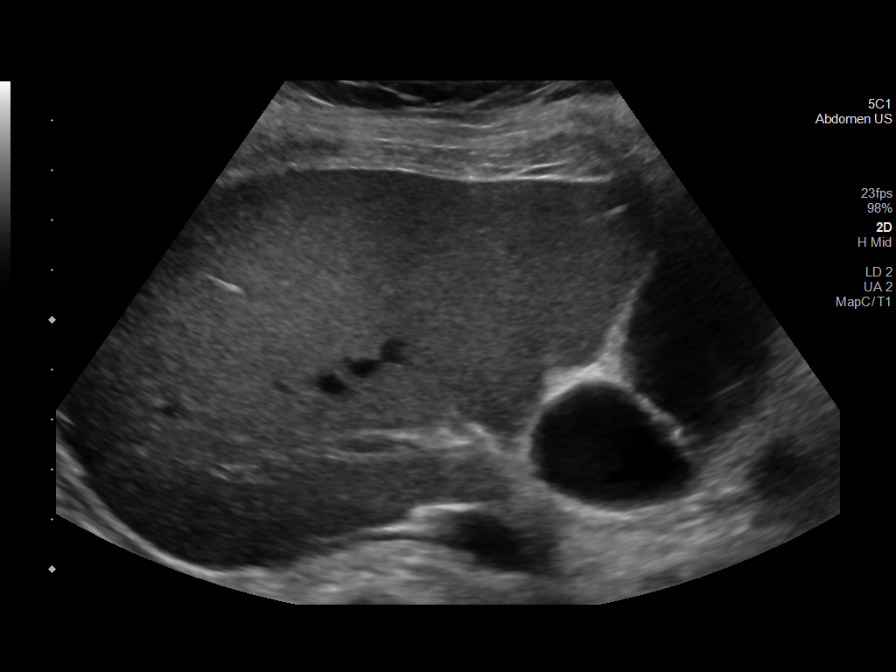
[im 9/9]
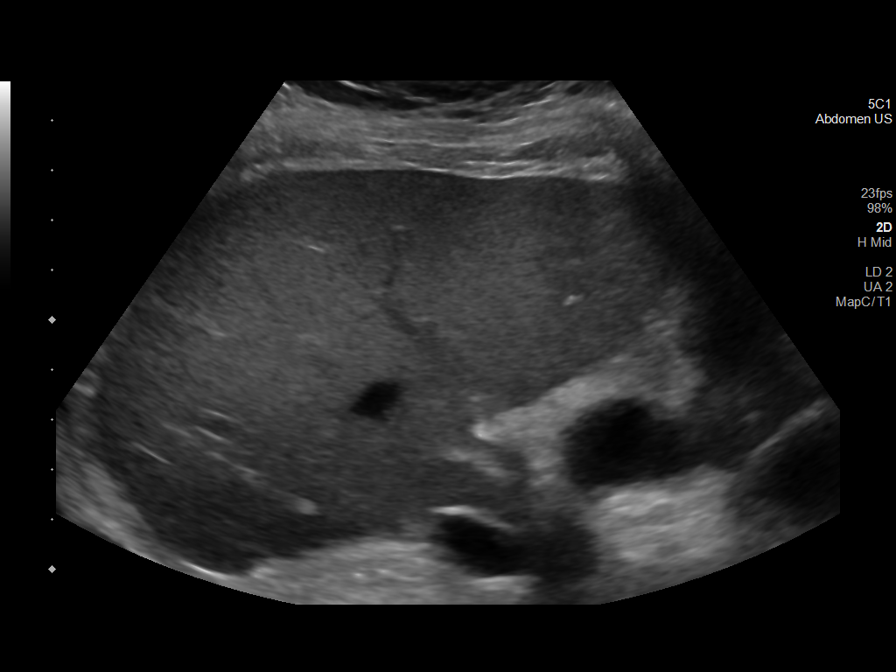

[9 of 9 positions shown; findings below may reference images not displayed]

EXAM:
Ultrasound-guided core biopsy of the liver

MEDICATIONS:
None.

ANESTHESIA/SEDATION:
None

FLUOROSCOPY TIME:  None

COMPLICATIONS:
None immediate.

PROCEDURE:
Informed consent was obtained from the patient following explanation
of the procedure, risks, benefits and alternatives. The patient
understands, agrees and consents for the procedure. All questions
were addressed. A time out was performed.

The right upper quadrant was interrogated with ultrasound. A
relatively avascular plane of the liver was identified. A suitable
skin entry site was selected and marked. The region was then
sterilely prepped and draped in standard fashion using chlorhexidine
skin prep. Local anesthesia was attained by infiltration with 1%
lidocaine. A small dermatotomy was made.

Under real-time sonographic guidance, a 17 gauge trocar needle was
advanced into the liver. Multiple 18 gauge core biopsies were then
coaxially obtained. Needle placement was confirmed on all biopsy
passes with real-time sonography. Biopsy specimens were placed in
formalin and delivered to pathology for further analysis.

Post biopsy ultrasound imaging demonstrates no active bleeding or
perihepatic hematoma. The patient tolerated the procedure well.
IMPRESSION: Technically successful ultrasound-guided random core biopsy of the
liver.

## 2022-10-25 IMAGING — DX DG CHEST 1V PORT
1 series · 1 of 1 positions shown · non-contrast
Comparison: Chest radiograph and CT 04/25/2021

CLINICAL DATA: Post central line placement.  Organ donor.

EXAM:
PORTABLE CHEST 1 VIEW

[chest ap]
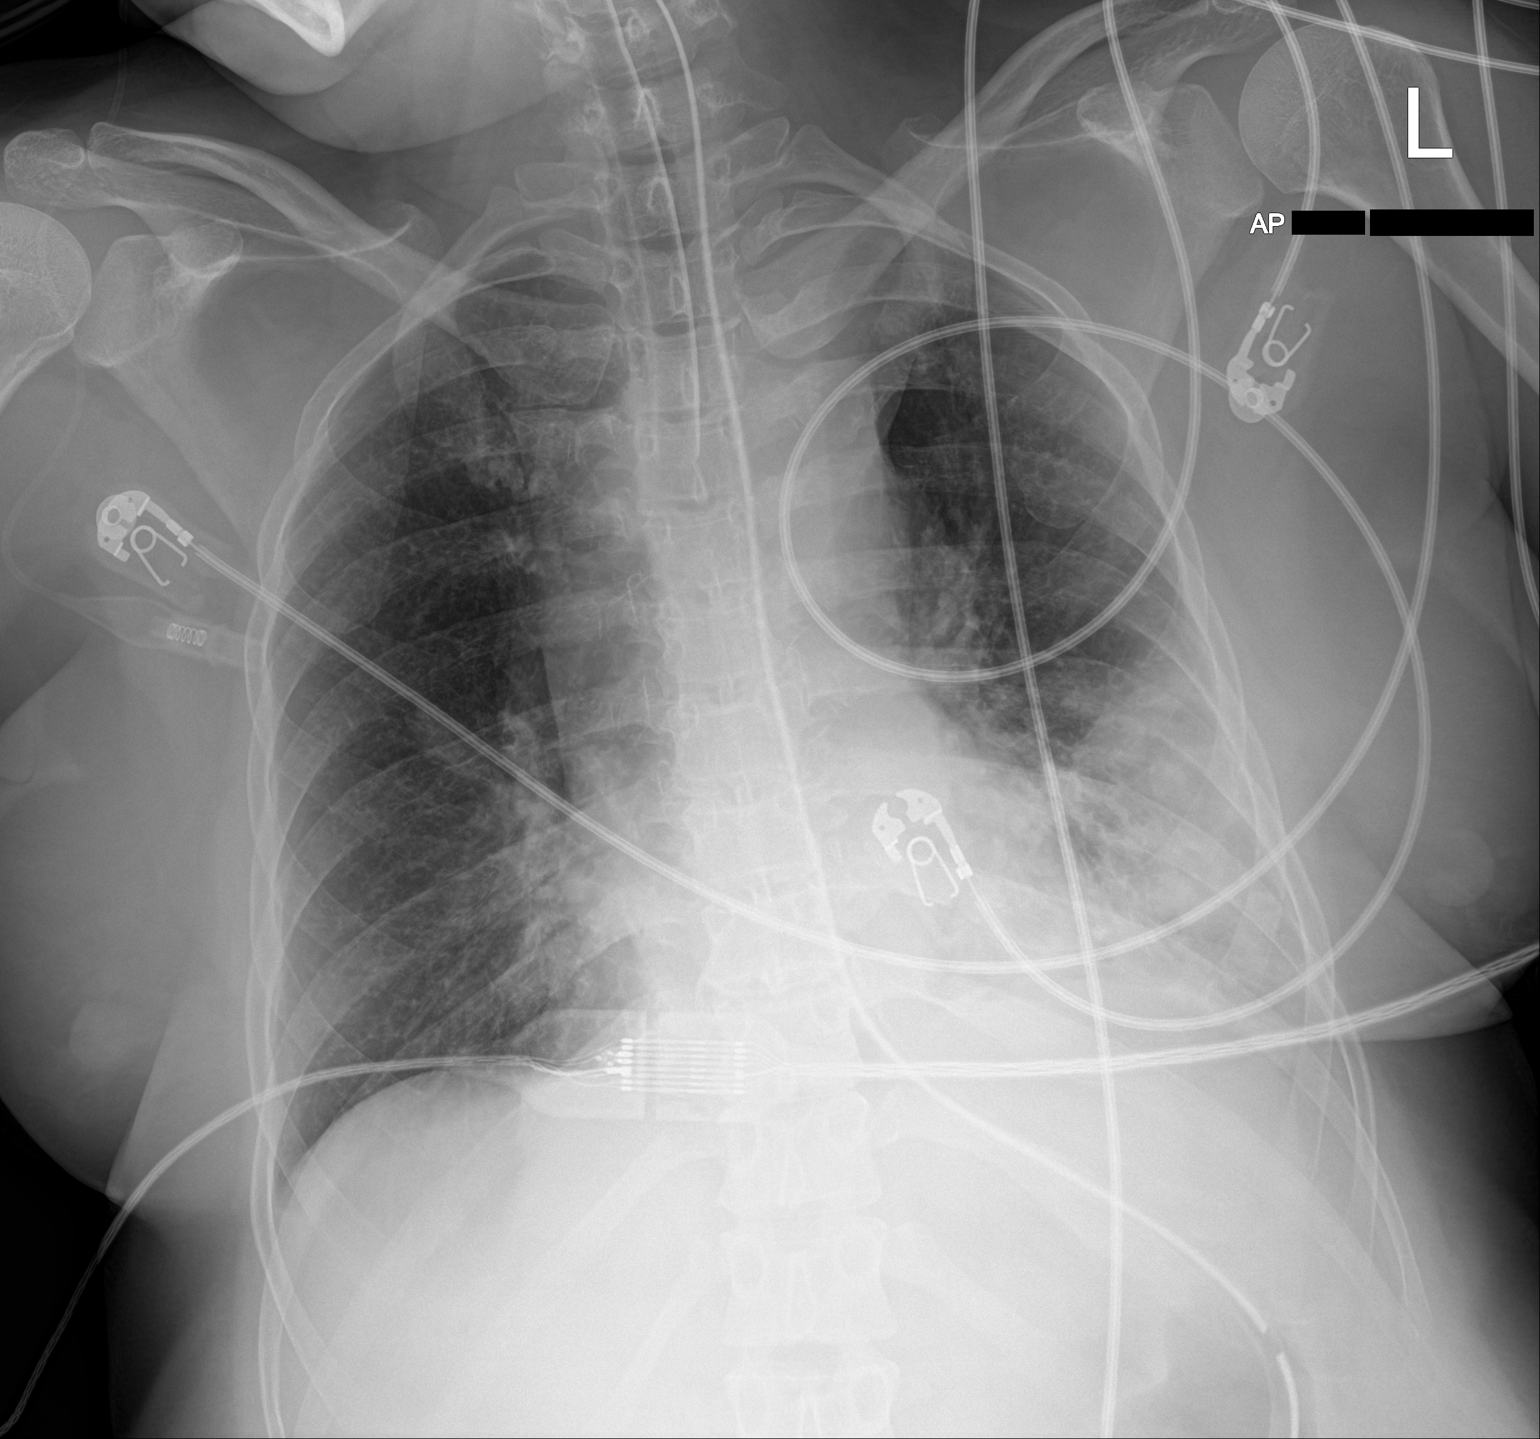

[1 of 1 positions shown; findings below may reference images not displayed]

FINDINGS: No central line is seen. No evidence of subclavian or jugular
catheter. Endotracheal tube tip is 3 cm from the carina. Tip and
side port of the enteric tube below the diaphragm. Stable heart size
and mediastinal contours. Left apical opacity likely due to tortuous
aorta 1 compared with prior CT. There is new opacity at the left
lung base. No pneumothorax or pleural effusion. Multiple overlying
monitoring devices in place.
IMPRESSION: 1. No central line is seen in the chest. Endotracheal tube and
enteric tube remain in place.
2. New left basilar opacity may be atelectasis or aspiration.
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
